# Patient Record
Sex: Male | Born: 1935 | Race: White | Hispanic: No
Health system: Southern US, Community
[De-identification: ages and names within clinical notes are randomized; demographics above are authoritative.]

## PROBLEM LIST (undated history)

## (undated) DIAGNOSIS — F0390 Unspecified dementia without behavioral disturbance: Secondary | ICD-10-CM

---

## 2020-07-22 ENCOUNTER — Emergency Department: Payer: Medicare Other

## 2020-07-22 ENCOUNTER — Emergency Department
Admission: EM | Admit: 2020-07-22 | Discharge: 2020-07-22 | Disposition: A | Discharge status: Home / Self Care | Payer: Medicare Other | Attending: Emergency Medicine | Admitting: Emergency Medicine

## 2020-07-22 ENCOUNTER — Other Ambulatory Visit: Payer: Self-pay

## 2020-07-22 DIAGNOSIS — Z7901 Long term (current) use of anticoagulants: Secondary | ICD-10-CM | POA: Insufficient documentation

## 2020-07-22 DIAGNOSIS — F0390 Unspecified dementia without behavioral disturbance: Secondary | ICD-10-CM | POA: Insufficient documentation

## 2020-07-22 DIAGNOSIS — S4992XA Unspecified injury of left shoulder and upper arm, initial encounter: Secondary | ICD-10-CM | POA: Diagnosis present

## 2020-07-22 DIAGNOSIS — S40022A Contusion of left upper arm, initial encounter: Secondary | ICD-10-CM | POA: Diagnosis not present

## 2020-07-22 DIAGNOSIS — X58XXXA Exposure to other specified factors, initial encounter: Secondary | ICD-10-CM | POA: Diagnosis not present

## 2020-07-22 LAB — COMPREHENSIVE METABOLIC PANEL
ALT: 9 U/L (ref 0–44)
AST: 21 U/L (ref 15–41)
Albumin: 3.6 g/dL (ref 3.5–5.0)
Alkaline Phosphatase: 67 U/L (ref 38–126)
Anion gap: 7 (ref 5–15)
BUN: 18 mg/dL (ref 8–23)
CO2: 25 mmol/L (ref 22–32)
Calcium: 8.4 mg/dL — ABNORMAL LOW (ref 8.9–10.3)
Chloride: 107 mmol/L (ref 98–111)
Creatinine, Ser: 1.44 mg/dL — ABNORMAL HIGH (ref 0.61–1.24)
GFR, Estimated: 44 mL/min — ABNORMAL LOW (ref >60)
Glucose, Bld: 85 mg/dL (ref 70–99)
Potassium: 3.8 mmol/L (ref 3.5–5.1)
Sodium: 139 mmol/L (ref 135–145)
Total Bilirubin: 1.2 mg/dL (ref 0.3–1.2)
Total Protein: 6.6 g/dL (ref 6.5–8.1)

## 2020-07-22 LAB — CBC WITH DIFFERENTIAL/PLATELET
Abs Immature Granulocytes: 0.04 10*3/uL (ref 0.00–0.07)
Basophils Absolute: 0.1 10*3/uL (ref 0.0–0.1)
Basophils Relative: 1 %
Eosinophils Absolute: 0.2 10*3/uL (ref 0.0–0.5)
Eosinophils Relative: 3 %
HCT: 24.6 % — ABNORMAL LOW (ref 39.0–52.0)
Hemoglobin: 8.1 g/dL — ABNORMAL LOW (ref 13.0–17.0)
Immature Granulocytes: 1 %
Lymphocytes Relative: 15 %
Lymphs Abs: 1 10*3/uL (ref 0.7–4.0)
MCH: 33.5 pg (ref 26.0–34.0)
MCHC: 32.9 g/dL (ref 30.0–36.0)
MCV: 101.7 fL — ABNORMAL HIGH (ref 80.0–100.0)
Monocytes Absolute: 0.6 10*3/uL (ref 0.1–1.0)
Monocytes Relative: 9 %
Neutro Abs: 4.8 10*3/uL (ref 1.7–7.7)
Neutrophils Relative %: 71 %
Platelets: 172 10*3/uL (ref 150–400)
RBC: 2.42 MIL/uL — ABNORMAL LOW (ref 4.22–5.81)
RDW: 17.4 % — ABNORMAL HIGH (ref 11.5–15.5)
WBC: 6.8 10*3/uL (ref 4.0–10.5)
nRBC: 0 % (ref 0.0–0.2)

## 2020-07-22 LAB — TROPONIN I (HIGH SENSITIVITY): Troponin I (High Sensitivity): 6 ng/L (ref <18)

## 2020-07-22 LAB — TYPE AND SCREEN
ABO/RH(D): O POS
Antibody Screen: NEGATIVE

## 2020-07-22 LAB — LACTIC ACID, PLASMA: Lactic Acid, Venous: 1.8 mmol/L (ref 0.5–1.9)

## 2020-07-22 NOTE — ED Notes (Signed)
[facility]  CALLED FOR  TRANSPORT  TO  [facility]

## 2020-07-22 NOTE — ED Triage Notes (Signed)
Patient to ER for c/o swelling to left arm since yesterday. Patient denies any known injury. Patient's vitals WNL per EMS. Patient takes Coumadin. Pulses present and WNL.

## 2020-07-22 NOTE — ED Provider Notes (Signed)
[facility] Emergency Department Provider Note   ____________________________________________   Event Date/Time   First MD Initiated Contact with Patient 07/22/20 1026     (approximate)  I have reviewed the triage vital signs and the nursing notes.   HISTORY  Chief Complaint Arm swelling    HPI Luis Doyle is a 87 y.o. male with a past medical history of dementia and A. fib on Coumadin who presents for left upper extremity ecchymosis and swelling since yesterday.  Patient presents via EMS from a long-term care facility who states they are concerned patient may have a DVT.  Patient denies any concerns or complaints at this time.  Patient is pleasantly demented and further history and review of systems are unable to be assessed at this time.         No past medical history on file.  There are no problems to display for this patient.     Prior to Admission medications   Not on File    Allergies Patient has no known allergies.  No family history on file.  Social History    Review of Systems Unable to assess ____________________________________________   PHYSICAL EXAM:  VITAL SIGNS: ED Triage Vitals  Enc Vitals Group     BP 07/22/20 1100 (!) 131/56     Pulse Rate 07/22/20 1024 (!) 59     Resp 07/22/20 1024 17     Temp 07/22/20 1024 98.8 F (37.1 C)     Temp Source 07/22/20 1024 Oral     SpO2 07/22/20 1024 100 %     Weight 07/22/20 1025 158 lb 1.1 oz (71.7 kg)     Height 07/22/20 1025 5\' 11"  (1.803 m)     Head Circumference --      Peak Flow --      Pain Score 07/22/20 1025 0     Pain Loc --      Pain Edu? --      Excl. in GC? --    Constitutional: Alert and oriented x1. Well appearing and in no acute distress. Eyes: Conjunctivae are normal. PERRL. Head: Atraumatic. Nose: No congestion/rhinnorhea. Mouth/Throat: Mucous membranes are moist. Neck: No stridor Cardiovascular: Grossly normal heart sounds.  Good peripheral  circulation. Respiratory: Normal respiratory effort.  No retractions. Gastrointestinal: Soft and nontender. No distention. Musculoskeletal: No obvious deformities Neurologic:  Normal speech and language. No gross focal neurologic deficits are appreciated. Skin:  Skin is warm and dry.  Circumferential ecchymosis appreciated to the left arm from the humerus to the hand sparing the palm Psychiatric: Mood and affect are normal. Speech and behavior are normal.  ____________________________________________   LABS (all labs ordered are listed, but only abnormal results are displayed)  Labs Reviewed  COMPREHENSIVE METABOLIC PANEL - Abnormal; Notable for the following components:      Result Value   Creatinine, Ser 1.44 (*)    Calcium 8.4 (*)    GFR, Estimated 44 (*)    All other components within normal limits  CBC WITH DIFFERENTIAL/PLATELET - Abnormal; Notable for the following components:   RBC 2.42 (*)    Hemoglobin 8.1 (*)    HCT 24.6 (*)    MCV 101.7 (*)    RDW 17.4 (*)    All other components within normal limits  LACTIC ACID, PLASMA  LACTIC ACID, PLASMA  TYPE AND SCREEN  TROPONIN I (HIGH SENSITIVITY)    RADIOLOGY  ED MD interpretation: Doppler ultrasound of the left upper extremity does not show  any evidence of acute DVT, abscess, or significant cellulitis  Official radiology report(s): US Venous Img Upper Left (DVT Study)  Result Date: 07/22/2020 CLINICAL DATA:  Left upper extremity edema EXAM: LEFT UPPER EXTREMITY VENOUS DOPPLER ULTRASOUND TECHNIQUE: Gray-scale sonography with graded compression, as well as color Doppler and duplex ultrasound were performed to evaluate the upper extremity deep venous system from the level of the subclavian vein and including the jugular, axillary, basilic, radial, ulnar and upper cephalic vein. Spectral Doppler was utilized to evaluate flow at rest and with distal augmentation maneuvers. COMPARISON:  None. FINDINGS: Contralateral Subclavian  Vein: Respiratory phasicity is normal and symmetric with the symptomatic side. No evidence of thrombus. Normal compressibility. Internal Jugular Vein: No evidence of thrombus. Normal compressibility, respiratory phasicity and response to augmentation. Subclavian Vein: No evidence of thrombus. Normal compressibility, respiratory phasicity and response to augmentation. Axillary Vein: No evidence of thrombus. Normal compressibility, respiratory phasicity and response to augmentation. Cephalic Vein: No evidence of thrombus. Normal compressibility, respiratory phasicity and response to augmentation. Basilic Vein: No evidence of thrombus. Normal compressibility, respiratory phasicity and response to augmentation. Brachial Veins: No evidence of thrombus. Normal compressibility, respiratory phasicity and response to augmentation. Radial Veins: No evidence of thrombus. Normal compressibility, respiratory phasicity and response to augmentation. Ulnar Veins: No evidence of thrombus. Normal compressibility, respiratory phasicity and response to augmentation. Venous Reflux:  None visualized. Other Findings:  None visualized. IMPRESSION: No evidence of DVT within the left upper extremity. Electronically Signed   By: Malachy Moan M.D.   On: 07/22/2020 11:38    ____________________________________________   PROCEDURES  Procedure(s) performed (including Critical Care):  .1-3 Lead EKG Interpretation Performed by: Merwyn Katos, MD Authorized by: Merwyn Katos, MD     Interpretation: normal     ECG rate:  66   ECG rate assessment: normal     Rhythm: sinus rhythm     Ectopy: none     Conduction: normal       ____________________________________________   INITIAL IMPRESSION / ASSESSMENT AND PLAN / ED COURSE  As part of my medical decision making, I reviewed the following data within the electronic MEDICAL RECORD NUMBER Nursing notes reviewed and incorporated, Labs reviewed, EKG interpreted, Old chart  reviewed, Radiograph reviewed and Notes from prior ED visits reviewed and incorporated        Patient is a 87 year old male who presents for left upper extremity swelling via EMS diagnosis for this patient includes but is not limited to DVT, cellulitis, abscess, arterial ischemia  Doppler ultrasound left upper extremity shows no evidence of DVT, edema, or significant abscess.  Patient is anemic at 8.1 with no prior for comparison.  Patient does not show any signs of tachycardia, hypotension, or hypoxia concerning for symptomatic anemia.  Dispo: The patient has been reexamined and is ready to be discharged.  All diagnostic results have been reviewed and discussed with the patient/family.  Care plan has been outlined and the patient/family understands all current diagnoses, results, and treatment plans.  There are no new complaints, changes, or physical findings at this time.  All questions have been addressed and answered.  Patient was instructed to, and agrees to follow-up with their primary care physician as well as return to the emergency department if any new or worsening symptoms develop.      ____________________________________________   FINAL CLINICAL IMPRESSION(S) / ED DIAGNOSES  Final diagnoses:  Traumatic ecchymosis of left upper arm, initial encounter     ED Discharge Orders  None       Note:  This document was prepared using Dragon voice recognition software and may include unintentional dictation errors.   Merwyn Katos, MD 07/22/20 [phone]

## 2020-07-22 NOTE — ED Notes (Signed)
Patient provided with meal while he awaits transport to [facility]

## 2020-09-20 ENCOUNTER — Ambulatory Visit (LOCAL_COMMUNITY_HEALTH_CENTER): Payer: Medicare Other

## 2020-09-20 ENCOUNTER — Telehealth: Payer: Self-pay

## 2020-09-20 VITALS — Ht 71.0 in | Wt 158.0 lb

## 2020-09-20 DIAGNOSIS — R7612 Nonspecific reaction to cell mediated immunity measurement of gamma interferon antigen response without active tuberculosis: Secondary | ICD-10-CM

## 2020-09-20 NOTE — Progress Notes (Signed)
EPI completed via phone with Tiffany from [facility]. [phone]). Patient's medical hx reviewed with Tiffany, not patient.  +QFT and CXR on 09/16/20. No known exposure to TB per patient's sone and has always had negative PPDs in the past; last PPD 11/2019.  Patient is a Sales executive.   CXR faxed for Dr. Alvester Morin review. No concerns for Active TB.  LTBI tx can be left up to the facility MD per [facility]. CXR sent for scanning Richmond Campbell, RN

## 2020-09-20 NOTE — Telephone Encounter (Signed)
TC received from Tiffany at [facility] on 09/18/20. States patient had +QFT on 09/14/20 and CXR completed following results.  CXR faxed to [facility] for Dr. Alvester Morin review. Dr. Alvester Morin reviewed CXR on 09/19/20 and has no concerns re: Active TB.  Per Dr. Alvester Morin facility physician can decided whether or not to treat patient for LTBI.  Patient has always had negative PPDs in the past per Tiffany.  See EPI for more info.Richmond Campbell, RN

## 2020-09-27 ENCOUNTER — Telehealth: Payer: Self-pay

## 2020-09-27 ENCOUNTER — Ambulatory Visit (INDEPENDENT_AMBULATORY_CARE_PROVIDER_SITE_OTHER): Payer: Medicare Other | Admitting: Internal Medicine

## 2020-09-27 ENCOUNTER — Encounter: Payer: Self-pay | Admitting: Internal Medicine

## 2020-09-27 ENCOUNTER — Other Ambulatory Visit: Payer: Self-pay

## 2020-09-27 VITALS — BP 146/63 | HR 69 | Temp 97.5°F | Wt 156.0 lb

## 2020-09-27 DIAGNOSIS — R7612 Nonspecific reaction to cell mediated immunity measurement of gamma interferon antigen response without active tuberculosis: Secondary | ICD-10-CM

## 2020-09-27 NOTE — Progress Notes (Signed)
    [facility]      Reason for Consult: positive Luis Doyle test    Referring Physician: Dr. Kerry Dory    Patient ID: Luis Doyle, male    DOB: 1922/10/24, 87 y.o.   MRN: [id number]  HPI:   Here for evaluation of a positive Quantiferon Gold test.  Previously tested negative in 2021.  Testing done as part of some routine testing requirement by the state of residents at his facility though no clear indication.  No history of Tb, no history of BCG vaccine, not receiving any immunsuppressive medications.  He is here with a caregiver from his facility who helps with the history.  He is located in the memory care until at [facility] in [place].  He had a CXR with subtle findings of left midlung infiltrate and left base and a small right pleural effusion.  He is asymptomatic though with no cough, no fever, no sob, no reported new dypsnea.  He though was put on levaquin for presumed pneumonia.  Previously tested negative to Tb with a ppd in 2021 by report. He is former Hotel manager.     \PMH: dementia, afib  Prior to Admission medications   Not on File    No Known Allergies  Social History   Tobacco Use  . Smoking status: Unknown If Ever Smoked  . Smokeless tobacco: Never Used  Vaping Use  . Vaping Use: Never used  Substance Use Topics  . Alcohol use: Not Currently  . Drug use: Not Currently   [facility]: + cardiac disease  Review of Systems  Constitutional: negative for fevers and chills Respiratory: negative for cough or sputum All other systems reviewed and are negative    Constitutional: well developed and well nourished There were no vitals filed for this visit. EYES: anicteric Cardiovascular: Cor RRR Respiratory: clear Musculoskeletal: no pedal edema noted Skin: negatives: no rash Neuro: non-focal  Labs: Lab Results  Component Value Date   WBC 6.8 07/22/2020   HGB 8.1 (L) 07/22/2020   HCT 24.6 (L) 07/22/2020   MCV 101.7 (H) 07/22/2020    PLT 172 07/22/2020    Lab Results  Component Value Date   CREATININE 1.44 (H) 07/22/2020   BUN 18 07/22/2020   NA 139 07/22/2020   K 3.8 07/22/2020   CL 107 07/22/2020   CO2 25 07/22/2020    Lab Results  Component Value Date   ALT 9 07/22/2020   AST 21 07/22/2020   ALKPHOS 67 07/22/2020   BILITOT 1.2 07/22/2020     Assessment: latent Tb.  He was tested reportedly due to a state requirment but otherwise had no particular indication for treatment.  He previously tested negative so I will repeat the blood test to see if it remains negative.   If positive, will offer treatment with INH 300 mg daily plus vitamin B6 50 mcg daily for 9 months Will then check a hepatic panel after 4 weeks  Currently being treated for pneumonia.  He is asymptomatic at this time so appears resolved.  I suspect his diagnosis was purely related to the very subtle findings on CXR but without symptoms is not significant and does not warrant treatment.   Plan: 1) repeat Quant Gold testing. If positive again, will treat as above  2) consider stopping levaquin

## 2020-09-27 NOTE — Telephone Encounter (Signed)
Patient is staying at [facility] in [place], contact number for them is [phone]. Danne Baxter is familiar with patient.   Sandie Ano, RN

## 2020-09-29 LAB — QUANTIFERON-TB GOLD PLUS
Mitogen-NIL: 1.91 IU/mL
NIL: 0.02 IU/mL
QuantiFERON-TB Gold Plus: NEGATIVE
TB1-NIL: 0.01 IU/mL
TB2-NIL: 0.03 IU/mL

## 2020-09-30 ENCOUNTER — Telehealth: Payer: Self-pay

## 2020-09-30 NOTE — Telephone Encounter (Signed)
RN attempted to call [facility] in [place], unable to speak with Danne Baxter at this time. RN called patient's son, voicemail greeting did not match son's name in chart.   Sandie Ano, RN

## 2020-09-30 NOTE — Telephone Encounter (Signed)
-----   Message from Gardiner Barefoot, MD sent at 09/30/2020  1:24 PM EDT ----- Could you let his facility and/or son know that his repeat Quantiferon Gold test is negative so his other positive test is more c/w a false positive and so no indication for any latent Tb treatment thanks

## 2020-10-01 NOTE — Telephone Encounter (Signed)
RN spoke to [employer], Engineer, civil (consulting) at [place], to relay negative [employer] and that there is no need for latent TB treatment per Dr. Luciana Axe as his prior positive test was consistent with a false positive. Tiffany requested faxed copy of results. RN faxed negative test result to [facility] at [phone].   Sandie Ano, RN

## 2021-01-16 ENCOUNTER — Emergency Department
Admission: EM | Admit: 2021-01-16 | Discharge: 2021-01-16 | Disposition: A | Discharge status: Home / Self Care | Payer: Medicare Other | Attending: Emergency Medicine | Admitting: Emergency Medicine

## 2021-01-16 ENCOUNTER — Emergency Department: Payer: Medicare Other

## 2021-01-16 ENCOUNTER — Other Ambulatory Visit: Payer: Self-pay

## 2021-01-16 DIAGNOSIS — S0990XA Unspecified injury of head, initial encounter: Secondary | ICD-10-CM

## 2021-01-16 DIAGNOSIS — S0083XA Contusion of other part of head, initial encounter: Secondary | ICD-10-CM | POA: Insufficient documentation

## 2021-01-16 NOTE — Discharge Instructions (Addendum)
Luis Doyle had a CT scan of his head and cervical spine which showed no evidence of acute brain spine injury.  He should return to the emergency department for new or worsening swelling, severe headache, confusion or change in mental status, or any other new or worsening symptoms that are concerning.
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
## 2021-01-16 NOTE — ED Notes (Signed)
Patient transported to CT 

## 2021-01-16 NOTE — ED Notes (Signed)
Pt's son called and updated on plan of care
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
## 2021-01-16 NOTE — ED Notes (Signed)
[facility]  called  for  transport  to  [facility]

## 2021-04-12 ENCOUNTER — Other Ambulatory Visit: Payer: Self-pay

## 2021-04-12 ENCOUNTER — Emergency Department
Admission: EM | Admit: 2021-04-12 | Discharge: 2021-04-12 | Disposition: A | Discharge status: Home / Self Care | Payer: Medicare Other | Attending: Emergency Medicine | Admitting: Emergency Medicine

## 2021-04-12 ENCOUNTER — Emergency Department: Payer: Medicare Other

## 2021-04-12 DIAGNOSIS — Z7901 Long term (current) use of anticoagulants: Secondary | ICD-10-CM | POA: Insufficient documentation

## 2021-04-12 DIAGNOSIS — F0390 Unspecified dementia without behavioral disturbance: Secondary | ICD-10-CM | POA: Diagnosis present

## 2021-04-12 DIAGNOSIS — N400 Enlarged prostate without lower urinary tract symptoms: Secondary | ICD-10-CM | POA: Diagnosis not present

## 2021-04-12 DIAGNOSIS — Z20822 Contact with and (suspected) exposure to covid-19: Secondary | ICD-10-CM | POA: Diagnosis not present

## 2021-04-12 DIAGNOSIS — R112 Nausea with vomiting, unspecified: Secondary | ICD-10-CM | POA: Insufficient documentation

## 2021-04-12 DIAGNOSIS — R1110 Vomiting, unspecified: Secondary | ICD-10-CM

## 2021-04-12 DIAGNOSIS — J189 Pneumonia, unspecified organism: Secondary | ICD-10-CM

## 2021-04-12 LAB — URINALYSIS, ROUTINE W REFLEX MICROSCOPIC
Bacteria, UA: NONE SEEN
Bilirubin Urine: NEGATIVE
Glucose, UA: NEGATIVE mg/dL
Ketones, ur: NEGATIVE mg/dL
Leukocytes,Ua: NEGATIVE
Nitrite: NEGATIVE
Protein, ur: NEGATIVE mg/dL
Specific Gravity, Urine: 1.021 (ref 1.005–1.030)
pH: 5 (ref 5.0–8.0)

## 2021-04-12 LAB — COMPREHENSIVE METABOLIC PANEL
ALT: 12 U/L (ref 0–44)
AST: 21 U/L (ref 15–41)
Albumin: 3.9 g/dL (ref 3.5–5.0)
Alkaline Phosphatase: 75 U/L (ref 38–126)
Anion gap: 9 (ref 5–15)
BUN: 22 mg/dL (ref 8–23)
CO2: 24 mmol/L (ref 22–32)
Calcium: 8.6 mg/dL — ABNORMAL LOW (ref 8.9–10.3)
Chloride: 104 mmol/L (ref 98–111)
Creatinine, Ser: 1.44 mg/dL — ABNORMAL HIGH (ref 0.61–1.24)
GFR, Estimated: 44 mL/min — ABNORMAL LOW (ref >60)
Glucose, Bld: 147 mg/dL — ABNORMAL HIGH (ref 70–99)
Potassium: 4.2 mmol/L (ref 3.5–5.1)
Sodium: 137 mmol/L (ref 135–145)
Total Bilirubin: 1.2 mg/dL (ref 0.3–1.2)
Total Protein: 7.3 g/dL (ref 6.5–8.1)

## 2021-04-12 LAB — RESP PANEL BY RT-PCR (FLU A&B, COVID) ARPGX2
Influenza A by PCR: NEGATIVE
Influenza B by PCR: NEGATIVE
SARS Coronavirus 2 by RT PCR: NEGATIVE

## 2021-04-12 LAB — CBC
HCT: 37.2 % — ABNORMAL LOW (ref 39.0–52.0)
Hemoglobin: 12.4 g/dL — ABNORMAL LOW (ref 13.0–17.0)
MCH: 33.7 pg (ref 26.0–34.0)
MCHC: 33.3 g/dL (ref 30.0–36.0)
MCV: 101.1 fL — ABNORMAL HIGH (ref 80.0–100.0)
Platelets: 155 10*3/uL (ref 150–400)
RBC: 3.68 MIL/uL — ABNORMAL LOW (ref 4.22–5.81)
RDW: 15.5 % (ref 11.5–15.5)
WBC: 13.9 10*3/uL — ABNORMAL HIGH (ref 4.0–10.5)
nRBC: 0 % (ref 0.0–0.2)

## 2021-04-12 LAB — LIPASE, BLOOD: Lipase: 32 U/L (ref 11–51)

## 2021-04-12 MED ORDER — AZITHROMYCIN 250 MG PO TABS: ORAL_TABLET | ORAL | 0 refills | Status: DC

## 2021-04-12 MED ORDER — CEFDINIR 300 MG PO CAPS
300.0000 mg | ORAL_CAPSULE | Freq: Two times a day (BID) | ORAL | Status: DC
  Administered 2021-04-12: 300 mg via ORAL
  Filled 2021-04-12 (×2): qty 1

## 2021-04-12 MED ORDER — ONDANSETRON 4 MG PO TBDP: 4.0000 mg | ORAL_TABLET | Freq: Three times a day (TID) | ORAL | 0 refills | Status: DC | PRN

## 2021-04-12 MED ORDER — AZITHROMYCIN 500 MG PO TABS
500.0000 mg | ORAL_TABLET | Freq: Once | ORAL | Status: AC
  Administered 2021-04-12: 500 mg via ORAL
  Filled 2021-04-12: qty 1

## 2021-04-12 MED ORDER — CEFDINIR 300 MG PO CAPS: 300.0000 mg | ORAL_CAPSULE | Freq: Two times a day (BID) | ORAL | 0 refills | Status: DC

## 2021-04-12 NOTE — ED Provider Notes (Signed)
[facility] Emergency Department Provider Note  ____________________________________________  Time seen: Approximately 2:09 PM  I have reviewed the triage vital signs and the nursing notes.   HISTORY  Chief Complaint Emesis    Level 5 Caveat: Portions of the History and Physical including HPI and review of systems are unable to be completely obtained due to patient being a poor historian   HPI Luis Doyle is a 87 y.o. male with a history of dementia who is sent to the ED due to an episode of vomiting this morning.  At [facility], his skilled nursing facility, he was noted to appear pale afterward, so EMS was called to bring him to the hospital.  Patient is unable to recall what happened earlier at this point.  He denies any pain.  States that he feels fine.  He is drinking a cup of water.  At bedside denies any other known symptoms, no recent illness.    History reviewed. No pertinent past medical history.   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 No pertinent surgical history.   Prior to Admission medications   Medication Sig Start Date End Date Taking? Authorizing Provider  acetaminophen (TYLENOL) 325 MG tablet Take 650 mg by mouth every 4 (four) hours as needed.    [provider]  ascorbic acid (VITAMIN C) 250 MG tablet Take 250 mg by mouth daily.    [provider]  Calcium Carbonate 500 MG CHEW Chew by mouth every 3 (three) hours as needed.    [provider]  Cholecalciferol (VITAMIN D3) 10 MCG (400 UNIT) CAPS Take by mouth daily.    [provider]  docusate sodium (COLACE) 100 MG capsule Take 100 mg by mouth daily.    [provider]  donepezil (ARICEPT) 10 MG tablet Take 10 mg by mouth daily. 09/16/20   [provider]  levofloxacin  (LEVAQUIN) 500 MG tablet Take 500 mg by mouth daily. Patient not taking: Reported on 01/16/2021 09/18/20   [provider]  Multiple Vitamin (MULTI VITAMIN PO) Take by mouth daily.    [provider]  ondansetron (ZOFRAN-ODT) 4 MG disintegrating tablet Take by mouth. 08/12/20   [provider]  saccharomyces boulardii (FLORASTOR) 250 MG capsule Take 250 mg by mouth 2 (two) times daily. Patient not taking: Reported on 01/16/2021    [provider]  sertraline (ZOLOFT) 25 MG tablet Take 25 mg by mouth daily. 09/16/20   [provider]  Thiamine HCl (THIAMINE PO) Take by mouth daily.    [provider]  warfarin (COUMADIN) 1 MG tablet Take 0.5 mg by mouth daily.    [provider]  warfarin (COUMADIN) 5 MG tablet Take 5 mg by mouth daily. Patient not taking: No sig reported 09/24/20   [provider]  warfarin (COUMADIN) 6 MG tablet Take 6 mg by mouth daily. 01/13/21   [provider]  zinc sulfate 220 (50 Zn) MG capsule Take 220 mg by mouth daily.    [provider]     Allergies Patient has no known allergies.   No family history on file.  Social History Social History   Tobacco Use   Smoking status: Unknown   Smokeless tobacco: Never  Vaping Use   Vaping Use: Never used  Substance Use Topics   Alcohol use: Not Currently   Drug  use: Not Currently    Review of Systems Level 5 Caveat: Portions of the History and Physical including HPI and review of systems are unable to be completely obtained due to patient being a poor historian   Constitutional:   No known fever.  ENT:   No rhinorrhea. Cardiovascular:   No chest pain or syncope. Respiratory:   No dyspnea or cough. Gastrointestinal:   Negative for abdominal pain, positive episode of vomiting and diarrhea Musculoskeletal:   Negative for focal pain or swelling ____________________________________________   PHYSICAL EXAM:  VITAL SIGNS: ED  Triage Vitals  Enc Vitals Group     BP 04/12/21 1043 (!) 126/57     Pulse Rate 04/12/21 1043 74     Resp 04/12/21 1043 20     Temp 04/12/21 1041 98.7 F (37.1 C)     Temp Source 04/12/21 1041 Oral     SpO2 04/12/21 1043 93 %     Weight 04/12/21 1042 145 lb (65.8 kg)     Height 04/12/21 1042 5\' 10"  (1.778 m)     Head Circumference --      Peak Flow --      Pain Score 04/12/21 1042 0     Pain Loc --      Pain Edu? --      Excl. in GC? --     Vital signs reviewed, nursing assessments reviewed.   Constitutional:   Alert and oriented to person and place. Non-toxic appearance. Eyes:   Conjunctivae are normal. EOMI. PERRL. ENT      Head:   Normocephalic and atraumatic.      Nose:   No congestion/rhinnorhea.       Mouth/Throat:   MMM, no pharyngeal erythema. No peritonsillar mass.       Neck:   No meningismus. Full ROM. Hematological/Lymphatic/Immunilogical:   No cervical lymphadenopathy. Cardiovascular:   RRR. Symmetric bilateral radial and DP pulses.  No murmurs. Cap refill less than 2 seconds. Respiratory:   Normal respiratory effort without tachypnea/retractions. Breath sounds are clear and equal bilaterally. No wheezes/rales/rhonchi. Gastrointestinal:   Soft and nontender. Non distended. There is no CVA tenderness.  No rebound, rigidity, or guarding. Genitourinary:   deferred Musculoskeletal:   Normal range of motion in all extremities. No joint effusions.  No lower extremity tenderness.  No edema. Neurologic:   Normal speech and language.  Motor grossly intact. No acute focal neurologic deficits are appreciated.  Skin:    Skin is warm, dry and intact. No rash noted.  No petechiae, purpura, or bullae.  ____________________________________________    LABS (pertinent positives/negatives) (all labs ordered are listed, but only abnormal results are displayed) Labs Reviewed  COMPREHENSIVE METABOLIC PANEL - Abnormal; Notable for the following components:      Result Value    Glucose, Bld 147 (*)    Creatinine, Ser 1.44 (*)    Calcium 8.6 (*)    GFR, Estimated 44 (*)    All other components within normal limits  CBC - Abnormal; Notable for the following components:   WBC 13.9 (*)    RBC 3.68 (*)    Hemoglobin 12.4 (*)    HCT 37.2 (*)    MCV 101.1 (*)    All other components within normal limits  URINALYSIS, ROUTINE W REFLEX MICROSCOPIC - Abnormal; Notable for the following components:   Color, Urine YELLOW (*)    APPearance HAZY (*)    Hgb urine dipstick MODERATE (*)    All other components within normal  limits  RESP PANEL BY RT-PCR (FLU A&B, COVID) ARPGX2  LIPASE, BLOOD   ____________________________________________   EKG Interpreted by me Normal sinus rhythm rate of 77, normal axis and intervals.  Normal QRS ST segments and T waves.  1 PVC on the strip.   ____________________________________________    RADIOLOGY  DG Chest 2 View  Result Date: 04/12/2021 CLINICAL DATA:  Cough, vomiting EXAM: CHEST - 2 VIEW COMPARISON:  None. FINDINGS: Prior median sternotomy and cardiac valve replacement. The heart size and mediastinal contours are within normal limits. Aortic atherosclerosis. Coarsened interstitial markings bilaterally with patchy airspace opacity in the left lower lobe. Healed left clavicular fracture. IMPRESSION: Patchy airspace opacity in the left lower lobe, suspicious for pneumonia. Electronically Signed   By: Duanne Guess D.O.   On: 04/12/2021 13:36    ____________________________________________   PROCEDURES Procedures  ____________________________________________  DIFFERENTIAL DIAGNOSIS   Bowel obstruction, hernia, diverticulitis, viral illness, foodborne illness  CLINICAL IMPRESSION / ASSESSMENT AND PLAN / ED COURSE  Medications ordered in the ED: Medications - No data to display  Pertinent labs & imaging results that were available during my care of the patient were reviewed by me and considered in my medical decision  making (see chart for details).   Luis Doyle was evaluated in Emergency Department on 04/12/2021 for the symptoms described in the history of present illness. He was evaluated in the context of the global COVID-19 pandemic, which necessitated consideration that the patient might be at risk for infection with the SARS-CoV-2 virus that causes COVID-19. Institutional protocols and algorithms that pertain to the evaluation of patients at risk for COVID-19 are in a state of rapid change based on information released by regulatory bodies including the CDC and federal and state organizations. These policies and algorithms were followed during the patient's care in the ED.   Patient presents with an episode of vomiting.  Denies any pain.  Exam is benign and reassuring.  However, with his dementia and inability to recall events of earlier and reliably report any symptoms, I will obtain a CT scan to evaluate for obstruction primarily.  Patient is tolerating oral intake and asymptomatic currently.      ____________________________________________   FINAL CLINICAL IMPRESSION(S) / ED DIAGNOSES    Final diagnoses:  Vomiting, unspecified vomiting type, unspecified whether nausea present  Chronic dementia [facility])     ED Discharge Orders     None       Portions of this note were generated with dragon dictation software. Dictation errors may occur despite best attempts at proofreading.   Sharman Cheek, MD 04/12/21 [phone]

## 2021-04-12 NOTE — Discharge Instructions (Signed)
Your lab tests today were okay.  Your Flu and Covid swab was negative.  Your chest xray shows a small area of developing pneumonia in the bottom of the left lung.  Take antibiotics as prescribed to resolve this.  If you have worsening shortness of breath, please return to the ER for further evaluation.

## 2021-04-12 NOTE — ED Provider Notes (Signed)
-----------------------------------------   3:09 PM on 04/12/2021 -----------------------------------------  Blood pressure (!) 125/53, pulse 77, temperature 98.7 F (37.1 C), temperature source Oral, resp. rate 18, height 5\' 10"  (1.778 m), weight 65.8 kg, SpO2 96 %.  Assuming care from Dr. .  In short, Luis Doyle is a 87 y.o. male with a chief complaint of Emesis .  Refer to the original H&P for additional details.  The current plan of care is to follow-up CT imaging of abdomen for acute onset of nausea and vomiting, remainder of workup is reassuring and if CT is negative then patient would be appropriate for discharge home.  ----------------------------------------- 3:53 PM on 04/12/2021 ----------------------------------------- CT scan is significant for what appears to be an enteritis along with opacities at patient's lung bases likely representing early pneumonia.  On reassessment, patient remains well-appearing and states he is feeling great.  He denies any difficulty breathing, nausea, chest pain, or abdominal pain.  He was given initial dose of antibiotics and prescribed antibiotic course per Dr. 04/14/2021.  He is appropriate for discharge back to nursing facility.    Scotty Court, MD 04/12/21 [phone]

## 2021-04-12 NOTE — ED Notes (Addendum)
Pt son called for transport back to [facility]

## 2021-04-12 NOTE — ED Triage Notes (Signed)
Pt to ED [facility] from [facility]. Son with pt. Reports flu sx started today. Emesis and diarrhea.  Pt denies pain. HOH

## 2021-09-27 ENCOUNTER — Encounter: Payer: Self-pay | Admitting: Emergency Medicine

## 2021-09-27 ENCOUNTER — Emergency Department
Admission: EM | Admit: 2021-09-27 | Discharge: 2021-09-27 | Disposition: A | Discharge status: Home / Self Care | Payer: Medicare Other | Attending: Emergency Medicine | Admitting: Emergency Medicine

## 2021-09-27 ENCOUNTER — Other Ambulatory Visit: Payer: Self-pay

## 2021-09-27 DIAGNOSIS — Z7901 Long term (current) use of anticoagulants: Secondary | ICD-10-CM | POA: Insufficient documentation

## 2021-09-27 DIAGNOSIS — R791 Abnormal coagulation profile: Secondary | ICD-10-CM | POA: Diagnosis not present

## 2021-09-27 DIAGNOSIS — F0390 Unspecified dementia without behavioral disturbance: Secondary | ICD-10-CM | POA: Diagnosis not present

## 2021-09-27 HISTORY — DX: Unspecified dementia, unspecified severity, without behavioral disturbance, psychotic disturbance, mood disturbance, and anxiety: F03.90

## 2021-09-27 LAB — CBC
HCT: 34 % — ABNORMAL LOW (ref 39.0–52.0)
Hemoglobin: 11.2 g/dL — ABNORMAL LOW (ref 13.0–17.0)
MCH: 33 pg (ref 26.0–34.0)
MCHC: 32.9 g/dL (ref 30.0–36.0)
MCV: 100.3 fL — ABNORMAL HIGH (ref 80.0–100.0)
Platelets: 181 10*3/uL (ref 150–400)
RBC: 3.39 MIL/uL — ABNORMAL LOW (ref 4.22–5.81)
RDW: 15.8 % — ABNORMAL HIGH (ref 11.5–15.5)
WBC: 10.2 10*3/uL (ref 4.0–10.5)
nRBC: 0 % (ref 0.0–0.2)

## 2021-09-27 LAB — COMPREHENSIVE METABOLIC PANEL
ALT: 16 U/L (ref 0–44)
AST: 21 U/L (ref 15–41)
Albumin: 3.7 g/dL (ref 3.5–5.0)
Alkaline Phosphatase: 87 U/L (ref 38–126)
Anion gap: 4 — ABNORMAL LOW (ref 5–15)
BUN: 30 mg/dL — ABNORMAL HIGH (ref 8–23)
CO2: 21 mmol/L — ABNORMAL LOW (ref 22–32)
Calcium: 8.4 mg/dL — ABNORMAL LOW (ref 8.9–10.3)
Chloride: 113 mmol/L — ABNORMAL HIGH (ref 98–111)
Creatinine, Ser: 1.5 mg/dL — ABNORMAL HIGH (ref 0.61–1.24)
GFR, Estimated: 42 mL/min — ABNORMAL LOW (ref >60)
Glucose, Bld: 98 mg/dL (ref 70–99)
Potassium: 3.8 mmol/L (ref 3.5–5.1)
Sodium: 138 mmol/L (ref 135–145)
Total Bilirubin: 0.6 mg/dL (ref 0.3–1.2)
Total Protein: 7.1 g/dL (ref 6.5–8.1)

## 2021-09-27 LAB — PROTIME-INR
INR: 9.5 (ref 0.8–1.2)
Prothrombin Time: 76.4 seconds — ABNORMAL HIGH (ref 11.4–15.2)

## 2021-09-27 NOTE — ED Notes (Signed)
Called EMS for transport back to [facility] 

## 2021-09-27 NOTE — ED Provider Notes (Signed)
? ?[facility] ?Provider Note ? ? ? Event Date/Time  ? First MD Initiated Contact with Patient 09/27/21 0243   ?  (approximate) ? ? ?History  ? ?Abnormal Labs  ? ? ?HPI ? ?Luis Doyle is a 88 y.o. male with a history of dementia who was sent from  house for elevated INR.  Patient is not sure why he is on Coumadin.  There are no records of the indication for Coumadin at the nursing home.  Review of epic shows prior records from the [place] and he seems like patient may have a prosthetic valve.  Patient is not sure.  His INR is elevated at 10 at the nursing home.  There is no signs of bleeding.  Patient denies hematuria, hemoptysis, melena, hematochezia, hematemesis, coffee-ground emesis. ?  ? ? ?Past Medical History:  ?Diagnosis Date  ? Dementia (HCC)   ? ? ?No past surgical history on file. ? ? ?Physical Exam  ? ?Triage Vital Signs: ?ED Triage Vitals  ?Enc Vitals Group  ?   BP 09/27/21 0033 (!) 142/65  ?   Pulse Rate 09/27/21 0033 67  ?   Resp 09/27/21 0033 16  ?   Temp 09/27/21 0033 98 ?F (36.7 ?C)  ?   Temp Source 09/27/21 0033 Oral  ?   SpO2 09/27/21 0033 99 %  ?   Weight 09/27/21 0034 150 lb (68 kg)  ?   Height 09/27/21 0034 6' (1.829 m)  ?   Head Circumference --   ?   Peak Flow --   ?   Pain Score 09/27/21 0034 0  ?   Pain Loc --   ?   Pain Edu? --   ?   Excl. in GC? --   ? ? ?Most recent vital signs: ?Vitals:  ? 09/27/21 0033  ?BP: (!) 142/65  ?Pulse: 67  ?Resp: 16  ?Temp: 98 ?F (36.7 ?C)  ?SpO2: 99%  ? ? ? ?Constitutional: Alert and oriented. Well appearing and in no apparent distress. ?HEENT: ?     Head: Normocephalic and atraumatic.    ?     Eyes: Conjunctivae are normal. Sclera is non-icteric.  ?     Mouth/Throat: Mucous membranes are moist.  ?     Neck: Supple with no signs of meningismus. ?Cardiovascular: Regular rate and rhythm. No murmurs, gallops, or rubs. 2+ symmetrical distal pulses are present in all extremities.  ?Respiratory: Normal respiratory effort. Lungs are clear  to auscultation bilaterally.  ?Gastrointestinal: Soft, non tender. ?Musculoskeletal:  No edema, cyanosis, or erythema of extremities. ?Neurologic: Normal speech and language. Face is symmetric. Moving all extremities. No gross focal neurologic deficits are appreciated. ?Skin: Skin is warm, dry and intact. No rash noted. ?Psychiatric: Mood and affect are normal. Speech and behavior are normal. ? ?ED Results / Procedures / Treatments  ? ?Labs ?(all labs ordered are listed, but only abnormal results are displayed) ?Labs Reviewed  ?PROTIME-INR - Abnormal; Notable for the following components:  ?    Result Value  ? Prothrombin Time 76.4 (*)   ? INR 9.5 (*)   ? All other components within normal limits  ?CBC - Abnormal; Notable for the following components:  ? RBC 3.39 (*)   ? Hemoglobin 11.2 (*)   ? HCT 34.0 (*)   ? MCV 100.3 (*)   ? RDW 15.8 (*)   ? All other components within normal limits  ?COMPREHENSIVE METABOLIC PANEL - Abnormal; Notable for the following components:  ?  Chloride 113 (*)   ? CO2 21 (*)   ? BUN 30 (*)   ? Creatinine, Ser 1.50 (*)   ? Calcium 8.4 (*)   ? GFR, Estimated 42 (*)   ? Anion gap 4 (*)   ? All other components within normal limits  ? ? ? ?EKG ? ?none ? ? ?RADIOLOGY ?none ? ? ?PROCEDURES: ? ?Critical Care performed: No ? ?Procedures ? ? ? ?IMPRESSION / MDM / ASSESSMENT AND PLAN / ED COURSE  ?I reviewed the triage vital signs and the nursing notes. ? ?88 y.o. male with a history of dementia who was sent from [facility] for elevated INR with no active bleeding. Patient is HD stable, stable hgb 11.2. INR here 9.5. Unclear why patient is on coumadin. Nursing home has no record for the indication of this medication. Patient does not know either. Based of limited records from the [place] it seems like patient has a mechanical valve therefore I don't feel that it is safe to give him a dose of vitamin K especially since there is no signs of bleeding. Recommended holding the coumadin for 2 days and  repeating INR on Monday. If INR is back to normal <3, they can restart coumadin. Recommended cutting the dose by 25% and close INR monitoring. Also recommended return to the ER for any signs of bleeding.  ? ?_________________________ ?3:26 AM on 09/27/2021 ?----------------------------------------- ?I was able to reach patient's son on the phone who confirms that patient is on Coumadin because of a mechanical valve therefore we will hold off vitamin K as previously stated. ? ?MEDICATIONS GIVEN IN ED: ?Medications - No data to display ? ? ?EMR reviewed records from the [place] ? ? ? ?FINAL CLINICAL IMPRESSION(S) / ED DIAGNOSES  ? ?Final diagnoses:  ?Supratherapeutic INR  ? ? ? ?Rx / DC Orders  ? ?ED Discharge Orders   ? ? None  ? ?  ? ? ? ?Note:  This document was prepared using Dragon voice recognition software and may include unintentional dictation errors. ? ? ?Please note:  Patient was evaluated in Emergency Department today for the symptoms described in the history of present illness. Patient was evaluated in the context of the global COVID-19 pandemic, which necessitated consideration that the patient might be at risk for infection with the SARS-CoV-2 virus that causes COVID-19. Institutional protocols and algorithms that pertain to the evaluation of patients at risk for COVID-19 are in a state of rapid change based on information released by regulatory bodies including the CDC and federal and state organizations. These policies and algorithms were followed during the patient's care in the ED.  Some ED evaluations and interventions may be delayed as a result of limited staffing during the pandemic. ? ? ? ? ?  ?Nita Sickle, MD ?09/27/21 0327 ? ?

## 2021-09-27 NOTE — Discharge Instructions (Signed)
Hold coumadin over the weekend. On Monday, recheck his INR. If INR>3 continue to hold and check INR daily. If INR<3 restart coumadin but decrease the dose to 4mg  daily. Recommend frequent INR checks until patient has stabilize his INR. Make sure to have close follow up with his doctor to help manage his INR.  Turn to the ER immediately if there is any bleeding in the urine, stool, sputum, or anywhere else. ?

## 2021-09-27 NOTE — ED Notes (Signed)
Pt takes warfarin 6mg  daily  ?

## 2021-09-27 NOTE — ED Triage Notes (Signed)
Pt presents to ER via EMS from [facility]. Per EMS pt sent due to abnormal labs. PT/INR 90/10.65 from blood work he had done today. Pt has dementia as baseline  ?

## 2022-08-09 ENCOUNTER — Other Ambulatory Visit: Payer: Self-pay

## 2022-08-09 ENCOUNTER — Emergency Department: Payer: Medicare Other

## 2022-08-09 ENCOUNTER — Emergency Department
Admission: EM | Admit: 2022-08-09 | Discharge: 2022-08-09 | Disposition: A | Discharge status: Home / Self Care | Payer: Medicare Other | Attending: Emergency Medicine | Admitting: Emergency Medicine

## 2022-08-09 DIAGNOSIS — N189 Chronic kidney disease, unspecified: Secondary | ICD-10-CM | POA: Insufficient documentation

## 2022-08-09 DIAGNOSIS — Z7901 Long term (current) use of anticoagulants: Secondary | ICD-10-CM | POA: Insufficient documentation

## 2022-08-09 DIAGNOSIS — D649 Anemia, unspecified: Secondary | ICD-10-CM | POA: Diagnosis not present

## 2022-08-09 DIAGNOSIS — R531 Weakness: Secondary | ICD-10-CM | POA: Diagnosis present

## 2022-08-09 DIAGNOSIS — R296 Repeated falls: Secondary | ICD-10-CM | POA: Insufficient documentation

## 2022-08-09 DIAGNOSIS — F0390 Unspecified dementia without behavioral disturbance: Secondary | ICD-10-CM | POA: Diagnosis not present

## 2022-08-09 LAB — URINALYSIS, ROUTINE W REFLEX MICROSCOPIC
Bilirubin Urine: NEGATIVE
Glucose, UA: NEGATIVE mg/dL
Hgb urine dipstick: NEGATIVE
Ketones, ur: NEGATIVE mg/dL
Leukocytes,Ua: NEGATIVE
Nitrite: NEGATIVE
Protein, ur: NEGATIVE mg/dL
Specific Gravity, Urine: 1.019 (ref 1.005–1.030)
pH: 5 (ref 5.0–8.0)

## 2022-08-09 LAB — CBC WITH DIFFERENTIAL/PLATELET
Abs Immature Granulocytes: 0.03 10*3/uL (ref 0.00–0.07)
Basophils Absolute: 0.1 10*3/uL (ref 0.0–0.1)
Basophils Relative: 1 %
Eosinophils Absolute: 0.2 10*3/uL (ref 0.0–0.5)
Eosinophils Relative: 2 %
HCT: 28.4 % — ABNORMAL LOW (ref 39.0–52.0)
Hemoglobin: 9.2 g/dL — ABNORMAL LOW (ref 13.0–17.0)
Immature Granulocytes: 0 %
Lymphocytes Relative: 17 %
Lymphs Abs: 1.2 10*3/uL (ref 0.7–4.0)
MCH: 33.8 pg (ref 26.0–34.0)
MCHC: 32.4 g/dL (ref 30.0–36.0)
MCV: 104.4 fL — ABNORMAL HIGH (ref 80.0–100.0)
Monocytes Absolute: 0.6 10*3/uL (ref 0.1–1.0)
Monocytes Relative: 8 %
Neutro Abs: 5.1 10*3/uL (ref 1.7–7.7)
Neutrophils Relative %: 72 %
Platelets: 112 10*3/uL — ABNORMAL LOW (ref 150–400)
RBC: 2.72 MIL/uL — ABNORMAL LOW (ref 4.22–5.81)
RDW: 16.4 % — ABNORMAL HIGH (ref 11.5–15.5)
WBC: 7.2 10*3/uL (ref 4.0–10.5)
nRBC: 0 % (ref 0.0–0.2)

## 2022-08-09 LAB — TROPONIN I (HIGH SENSITIVITY): Troponin I (High Sensitivity): 8 ng/L

## 2022-08-09 LAB — COMPREHENSIVE METABOLIC PANEL WITH GFR
ALT: 13 U/L (ref 0–44)
AST: 25 U/L (ref 15–41)
Albumin: 3.9 g/dL (ref 3.5–5.0)
Alkaline Phosphatase: 74 U/L (ref 38–126)
Anion gap: 4 — ABNORMAL LOW (ref 5–15)
BUN: 26 mg/dL — ABNORMAL HIGH (ref 8–23)
CO2: 26 mmol/L (ref 22–32)
Calcium: 8.4 mg/dL — ABNORMAL LOW (ref 8.9–10.3)
Chloride: 107 mmol/L (ref 98–111)
Creatinine, Ser: 1.32 mg/dL — ABNORMAL HIGH (ref 0.61–1.24)
GFR, Estimated: 49 mL/min — ABNORMAL LOW
Glucose, Bld: 96 mg/dL (ref 70–99)
Potassium: 3.5 mmol/L (ref 3.5–5.1)
Sodium: 137 mmol/L (ref 135–145)
Total Bilirubin: 0.7 mg/dL (ref 0.3–1.2)
Total Protein: 6.8 g/dL (ref 6.5–8.1)

## 2022-08-09 LAB — PROTIME-INR
INR: 2.6 — ABNORMAL HIGH (ref 0.8–1.2)
Prothrombin Time: 27.9 seconds — ABNORMAL HIGH (ref 11.4–15.2)

## 2022-08-09 NOTE — ED Notes (Signed)
Patient transported to CT 

## 2022-08-09 NOTE — ED Provider Notes (Signed)
[facility] Provider Note    Event Date/Time   First MD Initiated Contact with Patient 08/09/22 1357     (approximate)   History   Chief Complaint Fall   HPI  Luis Doyle is a 89 y.o. 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  Per son at bedside, patient was found down on the ground at his nursing facility earlier this morning, was able to stand up with the assistance of staff and was placed back in bed.  When staff went to get him for lunch, they again found him sitting down on the ground with no obvious signs of trauma.  Patient states he does not remember falling and currently denies any complaints.  He specifically denies any headache, neck pain, chest pain, abdominal pain, or extremity pain.  Son states that he is acting normally with no change in his cognitive status, does state he seems generally weaker than usual, had a harder time transitioning from wheelchair to stretcher than he normally would.  He has not had any recent fevers, son does endorse chronic cough that has seemed slightly worse than usual.  Patient denies any chest pain, shortness of breath, or dysuria.     Physical Exam   Triage Vital Signs: ED Triage Vitals  Enc Vitals Group     BP 08/09/22 1258 (!) 141/65     Pulse Rate 08/09/22 1258 70     Resp 08/09/22 1258 18     Temp 08/09/22 1258 98 F (36.7 C)     Temp src --      SpO2 08/09/22 1258 95 %     Weight --      Height --      Head Circumference --      Peak Flow --      Pain Score 08/09/22 1256 0     Pain Loc --      Pain Edu? --      Excl. in [place]? --     Most recent vital signs: Vitals:   08/09/22 1258  BP: (!) 141/65  Pulse: 70  Resp: 18  Temp: 98 F (36.7 C)  SpO2: 95%    Constitutional: Alert and oriented to person and place, but not time or situation. Eyes: Conjunctivae are normal. Head: Atraumatic. Nose: No  congestion/rhinnorhea. Mouth/Throat: Mucous membranes are moist.  Neck: No midline cervical spine tenderness to palpation. Cardiovascular: Normal rate, regular rhythm. Grossly normal heart sounds.  2+ radial pulses bilaterally. Respiratory: Normal respiratory effort.  No retractions. Lungs CTAB.  No chest wall tenderness to palpation. Gastrointestinal: Soft and nontender. No distention. Musculoskeletal: No lower extremity tenderness nor edema.  No upper extremity bony tenderness to palpation. Neurologic:  Normal speech and language. No gross focal neurologic deficits are appreciated.    ED Results / Procedures / Treatments   Labs (all labs ordered are listed, but only abnormal results are displayed) Labs Reviewed  URINALYSIS, ROUTINE W REFLEX MICROSCOPIC - Abnormal; Notable for the following components:      Result Value   Color, Urine YELLOW (*)    APPearance HAZY (*)    All other components within normal limits  CBC WITH DIFFERENTIAL/PLATELET - Abnormal; Notable for the following components:   RBC 2.72 (*)    Hemoglobin 9.2 (*)    HCT 28.4 (*)    MCV 104.4 (*)    RDW 16.4 (*)  Platelets 112 (*)    All other components within normal limits  COMPREHENSIVE METABOLIC PANEL - Abnormal; Notable for the following components:   BUN 26 (*)    Creatinine, Ser 1.32 (*)    Calcium 8.4 (*)    GFR, Estimated 49 (*)    Anion gap 4 (*)    All other components within normal limits  PROTIME-INR - Abnormal; Notable for the following components:   Prothrombin Time 27.9 (*)    INR 2.6 (*)    All other components within normal limits  TROPONIN I (HIGH SENSITIVITY)     EKG  ED ECG REPORT I, Blake Divine, the attending physician, personally viewed and interpreted this ECG.   Date: 08/09/2022  EKG Time: 15:31  Rate: 59  Rhythm: normal sinus rhythm  Axis: Normal  Intervals:none  ST&T Change: None  RADIOLOGY CT head reviewed and interpreted by me with no hemorrhage or midline  shift.  CT cervical spine reviewed and interpreted by me with no fracture or dislocation.  PROCEDURES:  Critical Care performed: No  Procedures   MEDICATIONS ORDERED IN ED: Medications - No data to display   IMPRESSION / MDM / [place] / ED COURSE  I reviewed the triage vital signs and the nursing notes.                              89 y.o. 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  Patient's presentation is most consistent with acute presentation with potential threat to life or bodily function.  Differential diagnosis includes, but is not limited to, intracranial injury, cervical spine injury, anemia, electrolyte abnormality, AKI, UTI, pneumonia.  Patient chronically ill but nontoxic-appearing and in no acute distress.  Vital signs are unremarkable and he has no focal neurologic deficits on exam, appears to be at his baseline mental status per son.  CT head and cervical spine are negative for acute traumatic injury.  Son does report patient appears generally weaker than usual and urinalysis shows no signs of infection.  We will further assess with chest x-ray, EKG, and labs.  EKG shows borderline sinus bradycardia with no ischemic changes, troponin within normal limits.  Additional labs are also reassuring with stable chronic kidney disease, no acute electrolyte abnormality, LFT abnormality, or leukocytosis.  Patient with mild anemia compared to previous but no recent bleeding noted.  Chest x-ray without evidence of pneumonia or other acute process.  Patient is appropriate for discharge home with outpatient follow-up, has follow-up with his PCP in 2 days.  Son counseled to have him return to the ED for new or worsening symptoms, son agrees with plan.      FINAL CLINICAL IMPRESSION(S) / ED  DIAGNOSES   Final diagnoses:  Multiple falls  Generalized weakness     Rx / DC Orders   ED Discharge Orders     None        Note:  This document was prepared using Dragon voice recognition software and may include unintentional dictation errors.   Blake Divine, MD 08/09/22 [phone]

## 2022-08-09 NOTE — ED Triage Notes (Signed)
Pt comes with c/o fall. Pt states he fell twice today. Pt is from [facility]. Pt was found in the floor by staff. Pt fell again before lunch too. Pt has skin tear to right elbow. Unsure if pt hit head.  Staff concerned for possible UTI also.

## 2022-08-09 NOTE — ED Notes (Addendum)
Pt discharge to home. Pt VSS, GCS 14, NAD. Pt family verbalized understanding of discharge instructions with no additional questions at this time. RN called report [facility] and notified of patient return.

## 2023-05-10 ENCOUNTER — Emergency Department
Admission: EM | Admit: 2023-05-10 | Discharge: 2023-05-10 | Disposition: A | Discharge status: Home / Self Care | Payer: Medicare Other | Attending: Student in an Organized Health Care Education/Training Program | Admitting: Student in an Organized Health Care Education/Training Program

## 2023-05-10 ENCOUNTER — Emergency Department: Payer: Medicare Other

## 2023-05-10 ENCOUNTER — Encounter: Payer: Self-pay | Admitting: Emergency Medicine

## 2023-05-10 ENCOUNTER — Other Ambulatory Visit: Payer: Self-pay

## 2023-05-10 DIAGNOSIS — R55 Syncope and collapse: Secondary | ICD-10-CM | POA: Diagnosis present

## 2023-05-10 DIAGNOSIS — Z20822 Contact with and (suspected) exposure to covid-19: Secondary | ICD-10-CM | POA: Diagnosis not present

## 2023-05-10 LAB — TROPONIN I (HIGH SENSITIVITY)
Troponin I (High Sensitivity): 7 ng/L (ref <18)
Troponin I (High Sensitivity): 8 ng/L (ref <18)

## 2023-05-10 LAB — URINALYSIS, ROUTINE W REFLEX MICROSCOPIC
Bilirubin Urine: NEGATIVE
Glucose, UA: NEGATIVE mg/dL
Ketones, ur: NEGATIVE mg/dL
Leukocytes,Ua: NEGATIVE
Nitrite: NEGATIVE
Protein, ur: NEGATIVE mg/dL
Specific Gravity, Urine: 1.018 (ref 1.005–1.030)
Squamous Epithelial / HPF: 0 /[HPF] (ref 0–5)
pH: 5 (ref 5.0–8.0)

## 2023-05-10 LAB — CBC WITH DIFFERENTIAL/PLATELET
Abs Immature Granulocytes: 0.07 10*3/uL (ref 0.00–0.07)
Basophils Absolute: 0.1 10*3/uL (ref 0.0–0.1)
Basophils Relative: 1 %
Eosinophils Absolute: 0.1 10*3/uL (ref 0.0–0.5)
Eosinophils Relative: 1 %
HCT: 31.8 % — ABNORMAL LOW (ref 39.0–52.0)
Hemoglobin: 10 g/dL — ABNORMAL LOW (ref 13.0–17.0)
Immature Granulocytes: 1 %
Lymphocytes Relative: 6 %
Lymphs Abs: 0.7 10*3/uL (ref 0.7–4.0)
MCH: 34 pg (ref 26.0–34.0)
MCHC: 31.4 g/dL (ref 30.0–36.0)
MCV: 108.2 fL — ABNORMAL HIGH (ref 80.0–100.0)
Monocytes Absolute: 0.6 10*3/uL (ref 0.1–1.0)
Monocytes Relative: 6 %
Neutro Abs: 9.8 10*3/uL — ABNORMAL HIGH (ref 1.7–7.7)
Neutrophils Relative %: 85 %
Platelets: 177 10*3/uL (ref 150–400)
RBC: 2.94 MIL/uL — ABNORMAL LOW (ref 4.22–5.81)
RDW: 16.7 % — ABNORMAL HIGH (ref 11.5–15.5)
WBC: 11.3 10*3/uL — ABNORMAL HIGH (ref 4.0–10.5)
nRBC: 0 % (ref 0.0–0.2)

## 2023-05-10 LAB — BASIC METABOLIC PANEL
Anion gap: 10 (ref 5–15)
BUN: 29 mg/dL — ABNORMAL HIGH (ref 8–23)
CO2: 29 mmol/L (ref 22–32)
Calcium: 8.6 mg/dL — ABNORMAL LOW (ref 8.9–10.3)
Chloride: 100 mmol/L (ref 98–111)
Creatinine, Ser: 1.24 mg/dL (ref 0.61–1.24)
GFR, Estimated: 53 mL/min — ABNORMAL LOW (ref >60)
Glucose, Bld: 116 mg/dL — ABNORMAL HIGH (ref 70–99)
Potassium: 4 mmol/L (ref 3.5–5.1)
Sodium: 139 mmol/L (ref 135–145)

## 2023-05-10 LAB — RESP PANEL BY RT-PCR (RSV, FLU A&B, COVID)  RVPGX2
Influenza A by PCR: NEGATIVE
Influenza B by PCR: NEGATIVE
Resp Syncytial Virus by PCR: NEGATIVE
SARS Coronavirus 2 by RT PCR: NEGATIVE

## 2023-05-10 NOTE — ED Notes (Signed)
First Nurse Note: Pt to ED via [facility] from [facility] for syncope. Unsure how long he was out for. Pt was sitting in a chair and did not fall. Pt does have a cough as well. CBG 175  BP: 146/60

## 2023-05-10 NOTE — ED Triage Notes (Signed)
Patient to ED via [facility] from [facility] after syncopal episode. PT was walking out of room when he had syncopal episode- was eased down in the floor with assistance of family and staff. PA at facility listened to patient afterwards and is concerned for pneumonia. Has had wet cough recently per family. Denies pain.

## 2023-05-10 NOTE — ED Provider Triage Note (Signed)
Emergency Medicine Provider Triage Evaluation Note  Luis Doyle , a 89 y.o. male  was evaluated in triage.  Pt complains of syncopal episode as he was walking out of his room, family was nearby and caught him, lowered him to the floor. Did not hit his head. PA at the facility believes he has pneumonia.   Review of Systems  Positive: cough Negative: Pain   Physical Exam  There were no vitals taken for this visit. Gen:   Awake, no distress   Resp:  Normal effort  MSK:   Moves extremities without difficulty  Other:    Medical Decision Making  Medically screening exam initiated at 10:15 AM.  Appropriate orders placed.  Wess Suleiman was informed that the remainder of the evaluation will be completed by another provider, this initial triage assessment does not replace that evaluation, and the importance of remaining in the ED until their evaluation is complete.     Cameron Ali, PA-C 05/10/23 1018

## 2023-05-10 NOTE — ED Provider Notes (Signed)
[facility] Provider Note    Event Date/Time   First MD Initiated Contact with Patient 05/10/23 Rickey Primus     (approximate)   History   Loss of Consciousness   HPI  Luis Doyle is a 89 y.o. male who presents to the ER from [facility] after syncopal event occurred earlier today.  He had a normal morning was ambulating down the hall and was being assisted out of the incorrect room weight he exhibited some weakness and was falling to the ground.  He was assisted to the ground did not hit his head.  Reportedly was unresponsive but breathing and had pulse for 20 to 30 seconds.  He did not have any shaking or seizure-like activity no postictal period he denies any pain.  No new medications.  Had prolonged observation period here in the ER secondary to excessive boarding and has not had any additional episodes or symptoms since being here.  According to report of family the staff did not listen to him were concerned for pneumonia but the patient has not been having any cough or shortness of breath.  No fevers or chills.     Physical Exam   Triage Vital Signs: ED Triage Vitals  Encounter Vitals Group     BP 05/10/23 1017 (!) 103/41     Systolic BP Percentile --      Diastolic BP Percentile --      Pulse Rate 05/10/23 1017 61     Resp 05/10/23 1017 18     Temp 05/10/23 1017 (!) 97.4 F (36.3 C)     Temp Source 05/10/23 1017 Axillary     SpO2 05/10/23 1017 91 %     Weight 05/10/23 1018 149 lb 14.6 oz (68 kg)     Height 05/10/23 1018 6' (1.829 m)     Head Circumference --      Peak Flow --      Pain Score 05/10/23 1018 0     Pain Loc --      Pain Education --      Exclude from Growth Chart --     Most recent vital signs: Vitals:   05/10/23 2007 05/10/23 2037  BP:  (!) 119/51  Pulse:  77  Resp: 18 17  Temp: 98.2 F (36.8 C)   SpO2:  95%     Constitutional: Alert, very hoh Eyes: Conjunctivae are normal.  Head: Atraumatic. Nose: No  congestion/rhinnorhea. Mouth/Throat: Mucous membranes are moist.   Neck: Painless ROM.  Cardiovascular:   Good peripheral circulation. Respiratory: Normal respiratory effort.  No retractions.  Gastrointestinal: Soft and nontender.  Musculoskeletal:  no deformity Neurologic:  MAE spontaneously. No gross focal neurologic deficits are appreciated.  Skin:  Skin is warm, dry and intact. No rash noted. Psychiatric: Mood and affect are normal. Speech and behavior are normal.    ED Results / Procedures / Treatments   Labs (all labs ordered are listed, but only abnormal results are displayed) Labs Reviewed  CBC WITH DIFFERENTIAL/PLATELET - Abnormal; Notable for the following components:      Result Value   WBC 11.3 (*)    RBC 2.94 (*)    Hemoglobin 10.0 (*)    HCT 31.8 (*)    MCV 108.2 (*)    RDW 16.7 (*)    Neutro Abs 9.8 (*)    All other components within normal limits  BASIC METABOLIC PANEL - Abnormal; Notable for the following components:   Glucose, Bld 116 (*)  BUN 29 (*)    Calcium 8.6 (*)    GFR, Estimated 53 (*)    All other components within normal limits  URINALYSIS, ROUTINE W REFLEX MICROSCOPIC - Abnormal; Notable for the following components:   Color, Urine YELLOW (*)    APPearance HAZY (*)    Hgb urine dipstick MODERATE (*)    Bacteria, UA RARE (*)    All other components within normal limits  RESP PANEL BY RT-PCR (RSV, FLU A&B, COVID)  [id number]  TROPONIN I (HIGH SENSITIVITY)  TROPONIN I (HIGH SENSITIVITY)     EKG  ED ECG REPORT I, Willy Eddy, the attending physician, personally viewed and interpreted this ECG.   Date: 05/10/2023  EKG Time: 10:20  Rate: 60  Rhythm: afib   Axis: normal  Intervals: normal qt  ST&T Change: no stemi, no depressiosn    RADIOLOGY Please see ED Course for my review and interpretation.  I personally reviewed all radiographic images ordered to evaluate for the above acute complaints and reviewed radiology reports and  findings.  These findings were personally discussed with the patient.  Please see medical record for radiology report.,   PROCEDURES:  Critical Care performed: No  Procedures   MEDICATIONS ORDERED IN ED: Medications - No data to display   IMPRESSION / MDM / ASSESSMENT AND PLAN / ED COURSE  I reviewed the triage vital signs and the nursing notes.                              Differential diagnosis includes, but is not limited to, dehydration, electrolyte abnormality, sepsis, dysrhythmia, ACS, SDH, IPH, vasovagal  Patient presenting to the ER for evaluation of symptoms as described above.  Based on symptoms, risk factors and considered above differential, this presenting complaint could reflect a potentially life-threatening illness therefore the patient will be placed on continuous pulse oximetry and telemetry for monitoring.  Laboratory evaluation will be sent to evaluate for the above complaints.      Clinical Course as of 05/10/23 2115  Mon May 10, 2023  2018 Chest x-ray on my review and interpretation without evidence of pneumothorax.  No findings to suggest acute abnormality. [PR]  2111 No sign of UTI.  Patient remains well-appearing is requesting discharge home.  Discussed the case and presentation workup with the patient's son POA.  Did not have any further concerns or questions.  We discussed option for observation in the hospital but given the patient's wishes to be discharged back to facility his age and goals of care I think that that is reasonable given his reassuring workup. [PR]    Clinical Course User Index [PR] Willy Eddy, MD     FINAL CLINICAL IMPRESSION(S) / ED DIAGNOSES   Final diagnoses:  Syncope, unspecified syncope type     Rx / DC Orders   ED Discharge Orders     None        Note:  This document was prepared using Dragon voice recognition software and may include unintentional dictation errors.    Willy Eddy, MD 05/10/23  2115

## 2023-05-10 NOTE — ED Notes (Signed)
Patient ambulatory to bed with one person assist. Pt has a very unsteady gait. Pt's clothing saturated in urine. Pt's clothing changed, and patient placed in bed in clean gown with warm blankets given.

## 2023-07-20 ENCOUNTER — Observation Stay
Admission: EM | Admit: 2023-07-20 | Discharge: 2023-07-22 | Disposition: A | Discharge status: Skilled Nursing Facility | Payer: Medicare Other | Attending: Obstetrics and Gynecology | Admitting: Obstetrics and Gynecology

## 2023-07-20 DIAGNOSIS — Z7901 Long term (current) use of anticoagulants: Secondary | ICD-10-CM | POA: Diagnosis not present

## 2023-07-20 DIAGNOSIS — Z23 Encounter for immunization: Secondary | ICD-10-CM | POA: Insufficient documentation

## 2023-07-20 DIAGNOSIS — I2510 Atherosclerotic heart disease of native coronary artery without angina pectoris: Secondary | ICD-10-CM | POA: Insufficient documentation

## 2023-07-20 DIAGNOSIS — N183 Chronic kidney disease, stage 3 unspecified: Secondary | ICD-10-CM | POA: Insufficient documentation

## 2023-07-20 DIAGNOSIS — R4182 Altered mental status, unspecified: Principal | ICD-10-CM | POA: Diagnosis present

## 2023-07-20 DIAGNOSIS — R451 Restlessness and agitation: Secondary | ICD-10-CM | POA: Diagnosis not present

## 2023-07-20 DIAGNOSIS — G9340 Encephalopathy, unspecified: Secondary | ICD-10-CM

## 2023-07-20 DIAGNOSIS — D649 Anemia, unspecified: Secondary | ICD-10-CM | POA: Diagnosis not present

## 2023-07-20 DIAGNOSIS — Z515 Encounter for palliative care: Secondary | ICD-10-CM | POA: Insufficient documentation

## 2023-07-20 DIAGNOSIS — T148XXA Other injury of unspecified body region, initial encounter: Secondary | ICD-10-CM

## 2023-07-20 DIAGNOSIS — Z79899 Other long term (current) drug therapy: Secondary | ICD-10-CM | POA: Insufficient documentation

## 2023-07-20 DIAGNOSIS — L989 Disorder of the skin and subcutaneous tissue, unspecified: Secondary | ICD-10-CM | POA: Diagnosis not present

## 2023-07-20 DIAGNOSIS — F0390 Unspecified dementia without behavioral disturbance: Secondary | ICD-10-CM | POA: Diagnosis not present

## 2023-07-20 DIAGNOSIS — Z952 Presence of prosthetic heart valve: Secondary | ICD-10-CM

## 2023-07-20 DIAGNOSIS — R456 Violent behavior: Secondary | ICD-10-CM | POA: Diagnosis present

## 2023-07-20 DIAGNOSIS — S299XXA Unspecified injury of thorax, initial encounter: Secondary | ICD-10-CM | POA: Diagnosis present

## 2023-07-21 ENCOUNTER — Emergency Department: Payer: Medicare Other

## 2023-07-21 ENCOUNTER — Other Ambulatory Visit: Payer: Self-pay

## 2023-07-21 DIAGNOSIS — D649 Anemia, unspecified: Secondary | ICD-10-CM | POA: Insufficient documentation

## 2023-07-21 DIAGNOSIS — Z7189 Other specified counseling: Secondary | ICD-10-CM

## 2023-07-21 DIAGNOSIS — R4182 Altered mental status, unspecified: Secondary | ICD-10-CM | POA: Diagnosis not present

## 2023-07-21 DIAGNOSIS — Z952 Presence of prosthetic heart valve: Secondary | ICD-10-CM

## 2023-07-21 DIAGNOSIS — F03918 Unspecified dementia, unspecified severity, with other behavioral disturbance: Secondary | ICD-10-CM | POA: Diagnosis not present

## 2023-07-21 DIAGNOSIS — N183 Chronic kidney disease, stage 3 unspecified: Secondary | ICD-10-CM | POA: Insufficient documentation

## 2023-07-21 DIAGNOSIS — G9340 Encephalopathy, unspecified: Secondary | ICD-10-CM

## 2023-07-21 LAB — CBC WITH DIFFERENTIAL/PLATELET
Abs Immature Granulocytes: 0.07 10*3/uL (ref 0.00–0.07)
Basophils Absolute: 0.1 10*3/uL (ref 0.0–0.1)
Basophils Relative: 1 %
Eosinophils Absolute: 0.1 10*3/uL (ref 0.0–0.5)
Eosinophils Relative: 1 %
HCT: 22.6 % — ABNORMAL LOW (ref 39.0–52.0)
Hemoglobin: 7.6 g/dL — ABNORMAL LOW (ref 13.0–17.0)
Immature Granulocytes: 1 %
Lymphocytes Relative: 13 %
Lymphs Abs: 1.2 10*3/uL (ref 0.7–4.0)
MCH: 33.9 pg (ref 26.0–34.0)
MCHC: 33.6 g/dL (ref 30.0–36.0)
MCV: 100.9 fL — ABNORMAL HIGH (ref 80.0–100.0)
Monocytes Absolute: 0.8 10*3/uL (ref 0.1–1.0)
Monocytes Relative: 9 %
Neutro Abs: 6.5 10*3/uL (ref 1.7–7.7)
Neutrophils Relative %: 75 %
Platelets: 159 10*3/uL (ref 150–400)
RBC: 2.24 MIL/uL — ABNORMAL LOW (ref 4.22–5.81)
RDW: 16.2 % — ABNORMAL HIGH (ref 11.5–15.5)
WBC: 8.6 10*3/uL (ref 4.0–10.5)
nRBC: 0 % (ref 0.0–0.2)

## 2023-07-21 LAB — URINALYSIS, ROUTINE W REFLEX MICROSCOPIC
Bacteria, UA: NONE SEEN
Bilirubin Urine: NEGATIVE
Glucose, UA: NEGATIVE mg/dL
Ketones, ur: NEGATIVE mg/dL
Leukocytes,Ua: NEGATIVE
Nitrite: NEGATIVE
Protein, ur: NEGATIVE mg/dL
Specific Gravity, Urine: 1.013 (ref 1.005–1.030)
pH: 6 (ref 5.0–8.0)

## 2023-07-21 LAB — COMPREHENSIVE METABOLIC PANEL
ALT: 17 U/L (ref 0–44)
AST: 22 U/L (ref 15–41)
Albumin: 3.2 g/dL — ABNORMAL LOW (ref 3.5–5.0)
Alkaline Phosphatase: 75 U/L (ref 38–126)
Anion gap: 9 (ref 5–15)
BUN: 26 mg/dL — ABNORMAL HIGH (ref 8–23)
CO2: 26 mmol/L (ref 22–32)
Calcium: 8.3 mg/dL — ABNORMAL LOW (ref 8.9–10.3)
Chloride: 104 mmol/L (ref 98–111)
Creatinine, Ser: 1.41 mg/dL — ABNORMAL HIGH (ref 0.61–1.24)
GFR, Estimated: 45 mL/min — ABNORMAL LOW (ref >60)
Glucose, Bld: 104 mg/dL — ABNORMAL HIGH (ref 70–99)
Potassium: 3.6 mmol/L (ref 3.5–5.1)
Sodium: 139 mmol/L (ref 135–145)
Total Bilirubin: 0.9 mg/dL (ref 0.0–1.2)
Total Protein: 6.2 g/dL — ABNORMAL LOW (ref 6.5–8.1)

## 2023-07-21 LAB — PREPARE RBC (CROSSMATCH)

## 2023-07-21 LAB — RETICULOCYTES
Immature Retic Fract: 12.8 % (ref 2.3–15.9)
RBC.: 2.26 MIL/uL — ABNORMAL LOW (ref 4.22–5.81)
Retic Count, Absolute: 28.5 10*3/uL (ref 19.0–186.0)
Retic Ct Pct: 1.3 % (ref 0.4–3.1)

## 2023-07-21 LAB — PROTIME-INR
INR: 2.3 — ABNORMAL HIGH (ref 0.8–1.2)
Prothrombin Time: 25.4 s — ABNORMAL HIGH (ref 11.4–15.2)

## 2023-07-21 MED ORDER — LORAZEPAM 2 MG/ML PO CONC: 1.0000 mg | ORAL | Status: DC | PRN

## 2023-07-21 MED ORDER — MORPHINE SULFATE (CONCENTRATE) 10 MG /0.5 ML PO SOLN: 5.0000 mg | ORAL | Status: DC | PRN

## 2023-07-21 MED ORDER — POLYVINYL ALCOHOL 1.4 % OP SOLN: 1.0000 [drp] | Freq: Four times a day (QID) | OPHTHALMIC | Status: DC | PRN

## 2023-07-21 MED ORDER — LIDOCAINE-EPINEPHRINE (PF) 2 %-1:200000 IJ SOLN
20.0000 mL | Freq: Once | INTRAMUSCULAR | Status: AC
  Administered 2023-07-21: 20 mL via INTRADERMAL
  Filled 2023-07-21: qty 20

## 2023-07-21 MED ORDER — IOHEXOL 300 MG/ML  SOLN
100.0000 mL | Freq: Once | INTRAMUSCULAR | Status: AC | PRN
  Administered 2023-07-21: 100 mL via INTRAVENOUS

## 2023-07-21 MED ORDER — LORAZEPAM 2 MG/ML IJ SOLN: 1.0000 mg | INTRAMUSCULAR | Status: DC | PRN

## 2023-07-21 MED ORDER — BACITRACIN ZINC 500 UNIT/GM EX OINT
TOPICAL_OINTMENT | Freq: Once | CUTANEOUS | Status: AC
  Administered 2023-07-21: 2 via TOPICAL
  Filled 2023-07-21: qty 1.8

## 2023-07-21 MED ORDER — ONDANSETRON HCL 4 MG PO TABS: 4.0000 mg | ORAL_TABLET | Freq: Four times a day (QID) | ORAL | Status: DC | PRN

## 2023-07-21 MED ORDER — ACETAMINOPHEN 325 MG PO TABS: 650.0000 mg | ORAL_TABLET | Freq: Four times a day (QID) | ORAL | Status: DC | PRN

## 2023-07-21 MED ORDER — GLYCOPYRROLATE 1 MG PO TABS: 1.0000 mg | ORAL_TABLET | ORAL | Status: DC | PRN

## 2023-07-21 MED ORDER — BACITRACIN ZINC 500 UNIT/GM EX OINT
TOPICAL_OINTMENT | Freq: Once | CUTANEOUS | Status: AC
  Filled 2023-07-21: qty 1.8

## 2023-07-21 MED ORDER — LORAZEPAM 1 MG PO TABS: 1.0000 mg | ORAL_TABLET | ORAL | Status: DC | PRN

## 2023-07-21 MED ORDER — TETANUS-DIPHTH-ACELL PERTUSSIS 5-2.5-18.5 LF-MCG/0.5 IM SUSY
0.5000 mL | PREFILLED_SYRINGE | Freq: Once | INTRAMUSCULAR | Status: AC
  Administered 2023-07-21: 0.5 mL via INTRAMUSCULAR
  Filled 2023-07-21: qty 0.5

## 2023-07-21 MED ORDER — HALOPERIDOL LACTATE 5 MG/ML IJ SOLN: 0.5000 mg | INTRAMUSCULAR | Status: DC | PRN

## 2023-07-21 MED ORDER — GLYCOPYRROLATE 0.2 MG/ML IJ SOLN: 0.2000 mg | INTRAMUSCULAR | Status: DC | PRN

## 2023-07-21 MED ORDER — HALOPERIDOL LACTATE 2 MG/ML PO CONC: 0.5000 mg | ORAL | Status: DC | PRN

## 2023-07-21 MED ORDER — LORAZEPAM 2 MG/ML IJ SOLN
0.5000 mg | Freq: Once | INTRAMUSCULAR | Status: AC
  Administered 2023-07-21: 0.5 mg via INTRAVENOUS
  Filled 2023-07-21: qty 1

## 2023-07-21 MED ORDER — LORAZEPAM 2 MG/ML IJ SOLN
1.0000 mg | Freq: Once | INTRAMUSCULAR | Status: AC
  Administered 2023-07-21: 1 mg via INTRAMUSCULAR
  Filled 2023-07-21: qty 1

## 2023-07-21 MED ORDER — SODIUM CHLORIDE 0.9 % IV SOLN: INTRAVENOUS | Status: DC

## 2023-07-21 MED ORDER — ONDANSETRON HCL 4 MG/2ML IJ SOLN: 4.0000 mg | Freq: Four times a day (QID) | INTRAMUSCULAR | Status: DC | PRN

## 2023-07-21 MED ORDER — BIOTENE DRY MOUTH MT LIQD: 15.0000 mL | OROMUCOSAL | Status: DC | PRN

## 2023-07-21 MED ORDER — SODIUM CHLORIDE 0.9 % IV SOLN
10.0000 mL/h | Freq: Once | INTRAVENOUS | Status: AC
  Administered 2023-07-21: 10 mL/h via INTRAVENOUS

## 2023-07-21 MED ORDER — ACETAMINOPHEN 650 MG RE SUPP: 650.0000 mg | Freq: Four times a day (QID) | RECTAL | Status: DC | PRN

## 2023-07-21 MED ORDER — HALOPERIDOL 0.5 MG PO TABS: 0.5000 mg | ORAL_TABLET | ORAL | Status: DC | PRN
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
## 2023-07-21 NOTE — Assessment & Plan Note (Addendum)
 Dementia  Progressive worsening confusion in setting of end stage dementia with ? Acute decompensation  CT imaging grossly WNL  No overt infection noted- UA WNL  Clinically dry  Noted decompensation in baseline hgb w/ coumadin use (hemoccult negative)  Monitor hgb  IVF hydration  Prn medication for agitation  Follow

## 2023-07-21 NOTE — Assessment & Plan Note (Addendum)
 Hgb 7.6 today w/ baseline hgb around 10-11  Noted coumadin use in setting of mechanical aortic valve  Hemoccult negative  S/p 1u pRBC  Will monitor hgb  Transfuse for hgb <7  Consider tagged RBC scan as appropriate  Palliative care consulted
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
## 2023-07-21 NOTE — Assessment & Plan Note (Signed)
 Cr 1.4 w/ GFR in 40s  Near baseline though clinically dry  IVF hydration  Monitor

## 2023-07-21 NOTE — ED Notes (Signed)
 Pt resting with eyes closed and mouth opened. Even rise and fall of chest. Call bell in reach. Bed in lowest locked position

## 2023-07-21 NOTE — H&P (Signed)
 History and Physical    Patient: Luis Doyle:[id number] DOB: 1922/08/19 DOA: 07/20/2023 DOS: the patient was seen and examined on 07/21/2023 PCP: Angela Cox, MD (Inactive)  Patient coming from: Home  Chief Complaint:  Chief Complaint  Patient presents with   Aggressive Behavior   HPI: Luis Doyle is a 89 y.o. 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  Limited history in the setting of encephalopathy.  Per report, patient with progressive agitation and confusion at memory care unit.  Noted to be significantly confused and talking to the son yesterday.  Per report, patient with severe agitation with patient being aggressive toward other patients in the memory unit.  No reported fevers or chills.  No reported nausea or vomiting.  No reports of cough or shortness of breath.  No reported abdominal pain or diarrhea.  Son does report?  Falls recently.  Per the son, has fairly chronic generalized confusion though this is seemed to acutely worsen. Presented to the ER afebrile, hemodynamically stable.  Satting well on room air.  White count 8.6, hemoglobin 7.6, platelets 159, creatinine 1.41, urinalysis negative for infection, INR 2.3.  CT head, CT C-spine, CT chest abdomen pelvis all grossly within normal limits.  Hemoccult negative. Review of Systems: As mentioned in the history of present illness. All other systems reviewed and are negative. Past Medical History:  Diagnosis Date   Dementia (HCC)    History reviewed. No pertinent surgical history. Social History:  reports that he has an unknown smoking status. He has never used smokeless tobacco. He reports that he does not currently use alcohol. He reports that he does not currently use drugs.  No Known Allergies  History reviewed. No pertinent family history.  Prior to Admission medications   Medication Sig Start Date End Date Taking?  Authorizing Provider  acetaminophen (TYLENOL) 325 MG tablet Take 650 mg by mouth every 4 (four) hours as needed.    [provider]  ascorbic acid (VITAMIN C) 250 MG tablet Take 250 mg by mouth daily.    [provider]  azithromycin (ZITHROMAX Z-PAK) 250 MG tablet Take 2 tablets (500 mg) on  Day 1,  followed by 1 tablet (250 mg) once daily on Days 2 through 5. 04/12/21   Sharman Cheek, MD  Calcium Carbonate 500 MG CHEW Chew by mouth every 3 (three) hours as needed.    [provider]  cefdinir (OMNICEF) 300 MG capsule Take 1 capsule (300 mg total) by mouth 2 (two) times daily. 04/12/21   Sharman Cheek, MD  Cholecalciferol (VITAMIN D3) 10 MCG (400 UNIT) CAPS Take by mouth daily.    [provider]  docusate sodium (COLACE) 100 MG capsule Take 100 mg by mouth daily.    [provider]  donepezil (ARICEPT) 10 MG tablet Take 10 mg by mouth daily. 09/16/20   [provider]  levofloxacin (LEVAQUIN) 500 MG tablet Take 500 mg by mouth daily. Patient not taking: Reported on 01/16/2021 09/18/20   [provider]  Multiple Vitamin (MULTI VITAMIN PO) Take by mouth daily.    [provider]  ondansetron (ZOFRAN ODT) 4 MG disintegrating tablet Take 1 tablet (4 mg total) by mouth every 8 (eight) hours as needed for nausea or vomiting. 04/12/21   Sharman Cheek, MD  saccharomyces boulardii (FLORASTOR) 250 MG capsule Take 250 mg by mouth 2 (two) times daily. Patient not taking: Reported on 01/16/2021    [provider]  sertraline (ZOLOFT) 25 MG tablet Take 25 mg by mouth daily. 09/16/20   [provider]  Thiamine HCl (THIAMINE PO) Take by mouth daily.    [provider]  warfarin (COUMADIN) 1 MG tablet Take 0.5 mg by mouth daily.    [provider]  warfarin (COUMADIN) 5 MG tablet Take 5 mg by mouth daily. Patient not taking: No sig reported 09/24/20   [provider]  warfarin (COUMADIN) 6  MG tablet Take 6 mg by mouth daily. 01/13/21   [provider]  zinc sulfate 220 (50 Zn) MG capsule Take 220 mg by mouth daily.    [provider]    Physical Exam: Vitals:   07/21/23 0500 07/21/23 0507 07/21/23 0645 07/21/23 0900  BP: (!) 129/46 (!) 129/46 (!) 148/52 (!) 136/56  Pulse: 61 61 (!) 57 (!) 56  Resp: 12 14 15 12   Temp:  97.6 F (36.4 C) (!) 96.6 F (35.9 C)   TempSrc:  Axillary Axillary   SpO2: 100% 100% 100% 100%   Physical Exam Constitutional:      Appearance: He is normal weight.  HENT:     Head: Normocephalic and atraumatic.     Mouth/Throat:     Mouth: Mucous membranes are dry.  Eyes:     Pupils: Pupils are equal, round, and reactive to light.  Cardiovascular:     Rate and Rhythm: Normal rate and regular rhythm.  Pulmonary:     Effort: Pulmonary effort is normal.  Abdominal:     General: Bowel sounds are normal.  Skin:    General: Skin is dry.  Neurological:     Comments: + generalized lethargy    Psychiatric:        Mood and Affect: Mood normal.     Data Reviewed:  There are no new results to review at this time.  CT CHEST ABDOMEN PELVIS W CONTRAST CLINICAL DATA:  Polytrauma, blunt  EXAM: CT CHEST, ABDOMEN, AND PELVIS WITH CONTRAST  TECHNIQUE: Multidetector CT imaging of the chest, abdomen and pelvis was performed following the standard protocol during bolus administration of intravenous contrast.  RADIATION DOSE REDUCTION: This exam was performed according to the departmental dose-optimization program which includes automated exposure control, adjustment of the mA and/or kV according to patient size and/or use of iterative reconstruction technique.  CONTRAST:  OMNIPAQUE IOHEXOL 300 MG/ML  SOLN  COMPARISON:  04/12/2021  FINDINGS: CT CHEST FINDINGS  Cardiovascular: Heart is normal size. Diffuse coronary artery and aortic atherosclerosis. No aneurysm or dissection. No filling defects in the pulmonary  arteries to suggest pulmonary emboli.  Mediastinum/Nodes: No mediastinal, hilar, or axillary adenopathy. Trachea and esophagus are unremarkable. Thyroid unremarkable.  Lungs/Pleura: Peripheral interstitial thickening with a basilar predominance most compatible with fibrosis. No acute confluent opacities. No effusions or pneumothorax.  Musculoskeletal: Chest wall soft tissues are unremarkable. No acute bony abnormality.  CT ABDOMEN PELVIS FINDINGS  Hepatobiliary: No focal hepatic abnormality. Gallbladder unremarkable.  Pancreas: No focal abnormality or ductal dilatation.  Spleen: No focal abnormality.  Normal size.  Adrenals/Urinary Tract: No adrenal abnormality. No focal renal abnormality. No stones or hydronephrosis. Urinary bladder is unremarkable.  Stomach/Bowel: Stomach, large and small bowel grossly unremarkable.  Vascular/Lymphatic: Aorta iliac atherosclerosis. No evidence of aneurysm or adenopathy.  Reproductive: Prominent prostate  Other: No free fluid or free air.  Musculoskeletal: No acute bony abnormality.  IMPRESSION: No acute findings in the chest, abdomen or pelvis.  Coronary artery disease, aortic atherosclerosis.  Chronic lung disease/fibrosis.  Prostate enlargement.  Electronically Signed   By: Charlett Nose M.D.   On: 07/21/2023 03:23 CT Cervical Spine Wo Contrast CLINICAL DATA:  Syncope, cough  EXAM: CT HEAD WITHOUT CONTRAST  CT CERVICAL SPINE WITHOUT CONTRAST  TECHNIQUE: Multidetector CT imaging of the head and cervical spine was performed following the standard protocol without intravenous contrast. Multiplanar CT image reconstructions of the cervical spine were also generated.  RADIATION DOSE REDUCTION: This exam was performed according to the departmental dose-optimization program which includes automated exposure control, adjustment of the mA and/or kV according to patient size and/or use of iterative reconstruction  technique.  COMPARISON:  CT head dated 05/10/2023. CT cervical spine dated 08/09/2022.  FINDINGS: CT HEAD FINDINGS  Brain: No evidence of acute infarction, hemorrhage, extra-axial collection or mass lesion/mass effect.  Global cortical and central atrophy with prominent extra-axial CSF space. Secondary ventriculomegaly.  Subcortical white matter and periventricular small vessel ischemic changes.  Vascular: Intracranial atherosclerosis.  Skull: Normal. Negative for fracture or focal lesion.  Sinuses/Orbits: The visualized paranasal sinuses are essentially clear. The mastoid air cells are unopacified.  Other: None.  CT CERVICAL SPINE FINDINGS  Alignment: Normal cervical lordosis.  Skull base and vertebrae: No acute fracture. No primary bone lesion or focal pathologic process.  Soft tissues and spinal canal: No prevertebral fluid or swelling. No visible canal hematoma.  Disc levels: Mild degenerative changes most prominent C6-7. Spinal canal is patent.  Upper chest: Visualized lung apices are notable for biapical pleural-parenchymal scarring.  Other: None.  IMPRESSION: No acute intracranial abnormality. Atrophy with small vessel ischemic changes.  No traumatic injury to the cervical spine. Mild degenerative changes.  Electronically Signed   By: Charline Bills M.D.   On: 07/21/2023 00:56 CT HEAD WO CONTRAST ( ) CLINICAL DATA:  Syncope, cough  EXAM: CT HEAD WITHOUT CONTRAST  CT CERVICAL SPINE WITHOUT CONTRAST  TECHNIQUE: Multidetector CT imaging of the head and cervical spine was performed following the standard protocol without intravenous contrast. Multiplanar CT image reconstructions of the cervical spine were also generated.  RADIATION DOSE REDUCTION: This exam was performed according to the departmental dose-optimization program which includes automated exposure control, adjustment of the mA and/or kV according to patient size and/or use of  iterative reconstruction technique.  COMPARISON:  CT head dated 05/10/2023. CT cervical spine dated 08/09/2022.  FINDINGS: CT HEAD FINDINGS  Brain: No evidence of acute infarction, hemorrhage, extra-axial collection or mass lesion/mass effect.  Global cortical and central atrophy with prominent extra-axial CSF space. Secondary ventriculomegaly.  Subcortical white matter and periventricular small vessel ischemic changes.  Vascular: Intracranial atherosclerosis.  Skull: Normal. Negative for fracture or focal lesion.  Sinuses/Orbits: The visualized paranasal sinuses are essentially clear. The mastoid air cells are unopacified.  Other: None.  CT CERVICAL SPINE FINDINGS  Alignment: Normal cervical lordosis.  Skull base and vertebrae: No acute fracture. No primary bone lesion or focal pathologic process.  Soft tissues and spinal canal: No prevertebral fluid or swelling. No visible canal hematoma.  Disc levels: Mild degenerative changes most prominent C6-7. Spinal canal is patent.  Upper chest: Visualized lung apices are notable for biapical pleural-parenchymal scarring.  Other: None.  IMPRESSION: No acute intracranial abnormality. Atrophy with small vessel ischemic changes.  No traumatic injury to the cervical spine. Mild degenerative changes.  Electronically Signed   By: Charline Bills M.D.   On: 07/21/2023 00:56  Lab Results  Component Value Date   WBC 8.6 07/21/2023   HGB 7.6 (L)  07/21/2023   HCT 22.6 (L) 07/21/2023   MCV 100.9 (H) 07/21/2023   PLT 159 07/21/2023   Last metabolic panel Lab Results  Component Value Date   GLUCOSE 104 (H) 07/21/2023   NA 139 07/21/2023   K 3.6 07/21/2023   CL 104 07/21/2023   CO2 26 07/21/2023   BUN 26 (H) 07/21/2023   CREATININE 1.41 (H) 07/21/2023   GFRNONAA 45 (L) 07/21/2023   CALCIUM 8.3 (L) 07/21/2023   PROT 6.2 (L) 07/21/2023   ALBUMIN 3.2 (L) 07/21/2023   BILITOT 0.9 07/21/2023   ALKPHOS 75 07/21/2023    AST 22 07/21/2023   ALT 17 07/21/2023   ANIONGAP 9 07/21/2023    Assessment and Plan: Encephalopathy Dementia  Progressive worsening confusion in setting of end stage dementia with ? Acute decompensation  CT imaging grossly WNL  No overt infection noted- UA WNL  Clinically dry  Noted decompensation in baseline hgb w/ coumadin use (hemoccult negative)  Monitor hgb  IVF hydration  Prn medication for agitation  Follow    CKD (chronic kidney disease) stage 3, GFR 30-59 ml/min (HCC) Cr 1.4 w/ GFR in 40s  Near baseline though clinically dry  IVF hydration  Monitor    H/O prosthetic heart valve Baseline prosthetic valve- on coumadin  INR 2.3  Will hold for now given downtrending hgb  Follow closely  Anemia Hgb 7.6 today w/ baseline hgb around 10-11  Noted coumadin use in setting of mechanical aortic valve  Hemoccult negative  S/p 1u pRBC  Will monitor hgb  Transfuse for hgb <7  Consider tagged RBC scan as appropriate  Palliative care consulted         Advance Care Planning:   Code Status: Full Code   Consults: Palliative care consulted   Family Communication: Case discussed w/ son over the phone   Severity of Illness: The appropriate patient status for this patient is OBSERVATION. Observation status is judged to be reasonable and necessary in order to provide the required intensity of service to ensure the patient's safety. The patient's presenting symptoms, physical exam findings, and initial radiographic and laboratory data in the context of their medical condition is felt to place them at decreased risk for further clinical deterioration. Furthermore, it is anticipated that the patient will be medically stable for discharge from the hospital within 2 midnights of admission.   Author: Floydene Flock, MD 07/21/2023 10:29 AM  For on call review [email or web address].

## 2023-07-21 NOTE — Consult Note (Addendum)
 Consultation Note Date: 07/21/2023   Patient Name: Luis Doyle  DOB: July 04, 1922  MRN: [id number]  Age / Sex: 89 y.o., male  PCP: Angela Cox, MD (Inactive) Referring Physician: Hannah Beat, MD  Reason for Consultation: Establishing goals of care  HPI/Patient Profile: PER H&P: "Thang Flett is a 89 y.o. 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  Limited history in the setting of encephalopathy.  Per report, patient with progressive agitation and confusion at memory care unit.  Noted to be significantly confused and talking to the son yesterday.  Per report, patient with severe agitation with patient being aggressive toward other patients in the memory unit.  No reported fevers or chills.  No reported nausea or vomiting.  No reports of cough or shortness of breath.  No reported abdominal pain or diarrhea.  Son does report?  Falls recently.  Per the son, has fairly chronic generalized confusion though this is seemed to acutely worsen".   Clinical Assessment and Goals of Care: Notes and labs reviewed. In to see patient, he is resting in bed with no family at bedside. He opens his eyes briefly upon touching his arm while helping to cover him up. He looks at me but does not try to speak.  Stepped out to call patient's son Greggory Stallion. He states at baseline his father walks to the main room for meals at his facility. He is confused and lives on a dementia unit.  He advises his father still toilets himself.  He discusses that his father and mother used to have a camper and would go to [place] in the winter in [place] in the summer.  He states his mother died around 3 years ago.    He states his father seemed to have a day recently where he began asking questions about his camper, his wife, and other things from his past,  which is not normal for him. He states after this, his father declined and became agitated.  We discussed the updates that have been provided to him.   We discussed his diagnoses, prognosis, GOC, EOL wishes disposition and options.  A detailed discussion was had today regarding advanced directives.  Concepts specific to code status, artifical feeding and hydration, IV antibiotics and rehospitalization were discussed.  The difference between an aggressive medical intervention path and a comfort care path was discussed.  Values and goals of care important to patient and family were attempted to be elicited.  Discussed limitations of medical interventions to prolong quality of life in some situations and discussed the concept of human mortality.  We discussed MOST form completed in 2022. He states code status was discussed earlier today by a provider who called him. He states with giving it thought since that conversation, he has decided he would not want CPR. He shares he wants his father to be happy and comfortable for what time he has left.  He states he does not want him to suffer.  He states he would like to switch his father to comfort care. He states he would not want any further lab work or painful procedures; he would not want CPR or ventilator support. He states he does not want his father returned to the hospital in the future. He indicates he will consider the use of antibiotics should an infection occur. He states he sees hospice liaisons at his father's facility, and would like hospice to follow there through end of life.  I completed a MOST form today electronically through [employer]. Each section of options on the form were reviewed in full detail and any questions were answered as needed. The patient's son outlined their wishes for the following treatment decisions:  Cardiopulmonary Resuscitation: Do Not Attempt Resuscitation (DNR/No CPR)  Medical Interventions: Comfort Measures: Keep clean,  warm, and dry. Use medication by any route, positioning, wound care, and other measures to relieve pain and suffering. Use oxygen, suction and manual treatment of airway obstruction as needed for comfort. Do not transfer to the hospital unless comfort needs cannot be met in current location.  Antibiotics: Determine use of limitation of antibiotics when infection occurs  IV Fluids: No IV fluids (provide other measures to ensure comfort)  Feeding Tube: No feeding tube          SUMMARY OF RECOMMENDATIONS    Comfort focused care.  Return to facility with hospice to follow.   Prognosis:  Poor overall      Primary Diagnoses: Present on Admission:  Altered mental status   I have reviewed the medical record, interviewed the patient and family, and examined the patient. The following aspects are pertinent.  Past Medical History:  Diagnosis Date   Dementia [facility])    Social History   Socioeconomic History   Marital status: Widowed    Spouse name: Not on file   Number of children: Not on file   Years of education: Not on file   Highest education level: Not on file  Occupational History   Not on file  Tobacco Use   Smoking status: Unknown   Smokeless tobacco: Never  Vaping Use   Vaping status: Never Used  Substance and Sexual Activity   Alcohol use: Not Currently   Drug use: Not Currently   Sexual activity: Not Currently  Other Topics Concern   Not on file  Social History Narrative   Not on file   Social Drivers of Health   Financial Resource Strain: Not on file  Food Insecurity: Not on file  Transportation Needs: Not on file  Physical Activity: Not on file  Stress: Not on file  Social Connections: Not on file   History reviewed. No pertinent family history. Scheduled Meds: Continuous Infusions:  sodium chloride 75 mL/hr at 07/21/23 1105   PRN Meds:.ondansetron **OR** ondansetron (ZOFRAN) IV Medications Prior to Admission:  Prior to Admission medications    Medication Sig Start Date End Date Taking? Authorizing Provider  acetaminophen (TYLENOL) 325 MG tablet Take 650 mg by mouth every 4 (four) hours as needed.   Yes [provider]  ALPRAZolam (XANAX) 0.25 MG tablet Take 0.25 mg by mouth. 1/2 tablet Monday, Wednesday, and friday 30 minutes prior to personal care 03/23/23  Yes [provider]  Calcium Carbonate 500 MG CHEW Chew by mouth every 3 (three) hours as needed.   Yes [provider]  Cholecalciferol (VITAMIN D3) 10 MCG (400 UNIT) CAPS Take by mouth daily.   Yes [provider]  docusate sodium (COLACE) 100  MG capsule Take 100 mg by mouth daily.   Yes [provider]  donepezil (ARICEPT) 10 MG tablet Take 10 mg by mouth daily. 09/16/20  Yes [provider]  loperamide (IMODIUM) 2 MG capsule Take 2 mg by mouth as needed for diarrhea or loose stools. 07/07/23  Yes [provider]  Multiple Vitamin (MULTI VITAMIN PO) Take by mouth daily.   Yes [provider]  sertraline (ZOLOFT) 25 MG tablet Take 25 mg by mouth daily. 09/16/20  Yes [provider]  Thiamine HCl (THIAMINE PO) Take by mouth daily.   Yes [provider]  warfarin (COUMADIN) 2 MG tablet Take 2 mg by mouth daily. 06/22/23  Yes [provider]  warfarin (COUMADIN) 4 MG tablet Take 4 mg by mouth daily. 06/22/23  Yes [provider]  ascorbic acid (VITAMIN C) 250 MG tablet Take 250 mg by mouth daily. Patient not taking: Reported on 07/21/2023    [provider]  azithromycin (ZITHROMAX Z-PAK) 250 MG tablet Take 2 tablets (500 mg) on  Day 1,  followed by 1 tablet (250 mg) once daily on Days 2 through 5. Patient not taking: Reported on 07/21/2023 04/12/21   Sharman Cheek, MD  cefdinir (OMNICEF) 300 MG capsule Take 1 capsule (300 mg total) by mouth 2 (two) times daily. Patient not taking: Reported on 07/21/2023 04/12/21   Sharman Cheek, MD  levofloxacin (LEVAQUIN) 500 MG  tablet Take 500 mg by mouth daily. Patient not taking: Reported on 01/16/2021 09/18/20   [provider]  ondansetron (ZOFRAN ODT) 4 MG disintegrating tablet Take 1 tablet (4 mg total) by mouth every 8 (eight) hours as needed for nausea or vomiting. Patient not taking: Reported on 07/21/2023 04/12/21   Sharman Cheek, MD  saccharomyces boulardii (FLORASTOR) 250 MG capsule Take 250 mg by mouth 2 (two) times daily. Patient not taking: Reported on 01/16/2021    [provider]  warfarin (COUMADIN) 1 MG tablet Take 0.5 mg by mouth daily. Patient not taking: Reported on 07/21/2023    [provider]  warfarin (COUMADIN) 5 MG tablet Take 5 mg by mouth daily. Patient not taking: Reported on 01/16/2021 09/24/20   [provider]  warfarin (COUMADIN) 6 MG tablet Take 6 mg by mouth daily. Patient not taking: Reported on 07/21/2023 01/13/21   [provider]  zinc sulfate 220 (50 Zn) MG capsule Take 220 mg by mouth daily. Patient not taking: Reported on 07/21/2023    [provider]   No Known Allergies Review of Systems  Unable to perform ROS   Physical Exam Constitutional:      Comments: Awoke to touch. Does not speak to me.     Vital Signs: BP (!) 145/50   Pulse (!) 51   Temp (!) 97 F (36.1 C) (Axillary)   Resp 13   SpO2 100%  Pain Scale: PAINAD       SpO2: SpO2: 100 % O2 Device:SpO2: 100 % O2 Flow Rate: .   IO: Intake/output summary:  Intake/Output Summary (Last 24 hours) at 07/21/2023 1544 Last data filed at 07/21/2023 0429 Gross per 24 hour  Intake 360 ml  Output --  Net 360 ml    LBM:   Baseline Weight:   Most recent weight:         Signed by: Morton Stall, NP   Please contact Palliative Medicine Team phone at [phone] for questions and concerns.  For individual provider: See Loretha Stapler

## 2023-07-21 NOTE — Assessment & Plan Note (Signed)
 Baseline prosthetic valve- on coumadin  INR 2.3  Will hold for now given downtrending hgb  Follow closely

## 2023-07-21 NOTE — ED Notes (Signed)
 This NT assisted Nurse Dorian in changing patient. Patient was wet. Patient has on a clean brief, a clean chux and a clean gown on. Posey alarm is on.

## 2023-07-22 DIAGNOSIS — D649 Anemia, unspecified: Secondary | ICD-10-CM | POA: Diagnosis not present

## 2023-07-22 DIAGNOSIS — R451 Restlessness and agitation: Secondary | ICD-10-CM

## 2023-07-22 DIAGNOSIS — Z515 Encounter for palliative care: Secondary | ICD-10-CM

## 2023-07-22 DIAGNOSIS — R4182 Altered mental status, unspecified: Secondary | ICD-10-CM | POA: Diagnosis not present

## 2023-07-22 MED ORDER — HALOPERIDOL 0.5 MG PO TABS: 0.5000 mg | ORAL_TABLET | ORAL | 0 refills | Status: DC | PRN

## 2023-07-22 MED ORDER — MORPHINE SULFATE (CONCENTRATE) 10 MG /0.5 ML PO SOLN: 5.0000 mg | ORAL | 0 refills | Status: DC | PRN

## 2023-07-22 MED ORDER — ALPRAZOLAM 0.25 MG PO TABS: 0.2500 mg | ORAL_TABLET | Freq: Two times a day (BID) | ORAL | 0 refills | Status: DC | PRN

## 2023-07-22 NOTE — TOC Transition Note (Signed)
 Transition of Care [facility]) - Discharge Note   Patient Details  Name: Luis Doyle MRN: [id number] Date of Birth: 19-Sep-1922  [facility] of [facility] CM/SW Contact:  Erin Sons, LCSW Phone Number: 07/22/2023, 3:42 PM   Clinical Narrative:     CSW spoke with pt son Greggory Stallion and confirmed plan for pt to return to [facility] with Hospice care. He chooses [facility] who [facility] is contracted with. CSW made referral with [facility]. Confirmed with [facility] that pt can return today. DC summary and fl2 faxed to [facility]. Non-emergency ambulance called for next available transport. RN to call report to [phone]  Final next level of care: Memory Care Barriers to Discharge: No Barriers Identified          Discharge Plan and Services Additional resources added to the After Visit Summary for                                       Social Drivers of Health (SDOH) Interventions SDOH Screenings   Food Insecurity: No Food Insecurity (07/22/2023)  Housing: Low Risk  (07/22/2023)  Transportation Needs: No Transportation Needs (07/22/2023)  Utilities: Not At Risk (07/22/2023)  Depression (PHQ2-9): Low Risk  (09/27/2020)  Social Connections: Moderately Isolated (07/22/2023)  Tobacco Use: Unknown (07/21/2023)     Readmission Risk Interventions     No data to display

## 2023-07-22 NOTE — [place] FL2 (Addendum)
 [place] MEDICAID FL2 LEVEL OF CARE FORM     IDENTIFICATION  Patient Name: Luis Doyle Birthdate: 08/02/1922 Sex: male Admission Date (Current Location): 07/20/2023  [place] and [place] Number:  Chiropodist and Address:  [facility], [address] [place], [place] [address]      Provider Number: [id number]  Attending Physician Name and Address:  Kathrynn Running, MD  Relative Name and Phone Number:       Current Level of Care: Hospital Recommended Level of Care: SCU Prior Approval Number:    Date Approved/Denied:   PASRR Number:    Discharge Plan: SCU    Current Diagnoses: Patient Active Problem List   Diagnosis Date Noted   Altered mental status 07/21/2023   Encephalopathy 07/21/2023   Anemia 07/21/2023   H/O prosthetic heart valve 07/21/2023   CKD (chronic kidney disease) stage 3, GFR 30-59 ml/min (HCC) 07/21/2023   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 09/20/2020    Orientation RESPIRATION BLADDER Height & Weight     Self  Normal Incontinent Weight:   Height:     BEHAVIORAL SYMPTOMS/MOOD NEUROLOGICAL BOWEL NUTRITION STATUS      Incontinent Diet (REgular)  AMBULATORY STATUS COMMUNICATION OF NEEDS Skin   Extensive Assist Verbally Other (Comment) (Skin tear right arm, skin tear left arm, skin tear pretibial left)                       Personal Care Assistance Level of Assistance  Bathing, Feeding, Dressing Bathing Assistance: Maximum assistance Feeding assistance: Independent Dressing Assistance: Limited assistance     Functional Limitations Info  Sight, Speech, Hearing Sight Info: Adequate Hearing Info: Adequate Speech Info: Impaired    SPECIAL CARE FACTORS FREQUENCY                       Contractures Contractures Info: Not present    Additional Factors Info  Code Status, Allergies Code Status Info: DNR Allergies Info: no known  allergies            Discharge Medications: STOP taking these medications     ascorbic acid 250 MG tablet Commonly known as: VITAMIN C    azithromycin 250 MG tablet Commonly known as: Zithromax Z-Pak    Calcium Carbonate 500 MG Chew    cefdinir 300 MG capsule Commonly known as: OMNICEF    donepezil 10 MG tablet Commonly known as: ARICEPT    levofloxacin 500 MG tablet Commonly known as: LEVAQUIN    MULTI VITAMIN PO    saccharomyces boulardii 250 MG capsule Commonly known as: FLORASTOR    THIAMINE PO    Vitamin D3 10 MCG (400 UNIT) Caps    warfarin 1 MG tablet Commonly known as: COUMADIN    warfarin 2 MG tablet Commonly known as: COUMADIN    warfarin 4 MG tablet Commonly known as: COUMADIN    warfarin 5 MG tablet Commonly known as: COUMADIN    warfarin 6 MG tablet Commonly known as: COUMADIN    zinc sulfate (50mg  elemental zinc) 220 (50 Zn) MG capsule           TAKE these medications     acetaminophen 325 MG tablet Commonly known as: TYLENOL Take 650 mg by mouth every 4 (four) hours as needed.    ALPRAZolam 0.25 MG tablet Commonly known as: XANAX Take 1 tablet (0.25 mg total) by mouth 2 (two) times daily as needed  for anxiety. 1/2 tablet Monday, Wednesday, and friday 30 minutes prior to personal care What changed:  when to take this reasons to take this    docusate sodium 100 MG capsule Commonly known as: COLACE Take 100 mg by mouth daily.    haloperidol 0.5 MG tablet Commonly known as: HALDOL Take 1 tablet (0.5 mg total) by mouth every 4 (four) hours as needed for agitation (or delirium).    loperamide 2 MG capsule Commonly known as: IMODIUM Take 2 mg by mouth as needed for diarrhea or loose stools.    morphine CONCENTRATE 10 mg / 0.5 ml concentrated solution Place 0.25 mLs (5 mg total) under the tongue every 2 (two) hours as needed for moderate pain (pain score 4-6) (or dyspnea).    ondansetron 4 MG disintegrating tablet Commonly  known as: Zofran ODT Take 1 tablet (4 mg total) by mouth every 8 (eight) hours as needed for nausea or vomiting.    sertraline 25 MG tablet Commonly known as: ZOLOFT Take 25 mg by mouth daily.      Relevant Imaging Results:  Relevant Lab Results:   Additional Information SSN [id number] [place], [place]

## 2023-07-22 NOTE — Care Management Obs Status (Signed)
 MEDICARE OBSERVATION STATUS NOTIFICATION   Patient Details  Name: Luis Doyle MRN: [id number] Date of Birth: December 11, 1922   Medicare Observation Status Notification Given:  Orland Dec, CMA 07/22/2023, 2:49 PM

## 2023-07-22 NOTE — Progress Notes (Signed)
 Palliative Care Progress Note, Assessment & Plan   Patient Name: Luis Doyle       Date: 07/22/2023 DOB: [birth year]  Age: 89 y.o. MRN#: [id number] Attending Physician: Kathrynn Running, MD Primary Care Physician: Smiley Houseman, NP Admit Date: 07/20/2023  Subjective: Patient is lying in bed in no apparent distress.  He acknowledges my presence and is able to make his wishes known.  He is saying he needs to urinate.  No family or friends present during my visit.  HPI: From H&P: Suraj Ramdass is a 89 y.o. 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  Limited history in the setting of encephalopathy.  Per report, patient with progressive agitation and confusion at memory care unit.  Noted to be significantly confused and talking to the son yesterday.  Per report, patient with severe agitation with patient being aggressive toward other patients in the memory unit.  No reported fevers or chills.  No reported nausea or vomiting.  No reports of cough or shortness of breath.  No reported abdominal pain or diarrhea.  Son does report?  Falls recently.  Per the son, has fairly chronic generalized confusion though this is seemed to acutely worsen   Summary of counseling/coordination of care: Extensive chart review completed prior to meeting patient including labs, vital signs, imaging, progress notes, orders, and available advanced directive documents from current and previous encounters.   After reviewing the patient's chart and assessing the patient at bedside, I spoke with patient in regards to symptom management and goals of care.   Patient says he needs to go the bathroom "right now".  Urinal provided and assistance given  for patient to urinate. He made 100cc of dark yellow urine. RN notified.   Symptoms assessed.  Patient endorses relief after using urinal.  He shares that he has been wanting to do that for about an hour and had no relief.  He has no other acute complaints at this time.  Denies headache, chest pain, and other acute illness.  No adjustment to [facility] needed.  Patient asking for breakfast - no diet ordered.   Therapeutic silence and active listening provided. No adjustment to [facility] at this time.   Plan remains for patient to transfer to [facility] with hospice services to follow.  No acute palliative needs at this time. PMT remains available to patient and family throughout his hospitalization.   Physical Exam Constitutional:      Comments: Thin, frail  HENT:     Head: Normocephalic.     Nose: Nose normal.     Mouth/Throat:     Mouth: Mucous membranes are dry.  Eyes:     Pupils: Pupils are equal, round, and reactive to light.  Pulmonary:     Effort: Pulmonary effort is normal.  Abdominal:     Palpations: Abdomen is soft.  Neurological:     Mental Status: He is alert.     Comments: Oriented to self  Psychiatric:        Behavior: Behavior normal.             Total Time 35 minutes   Time spent includes: Detailed  review of medical records (labs, imaging, vital signs), medically appropriate exam (mental status, respiratory, cardiac, skin), discussed with treatment team, counseling and educating patient, family and staff, documenting clinical information, medication management and coordination of care.  Samara Deist L. Bonita Quin, DNP, FNP-BC Palliative Medicine Team

## 2023-07-22 NOTE — Plan of Care (Signed)

## 2023-07-22 NOTE — Progress Notes (Addendum)
 [facility] Liaison Note  Received a referral from [facility], Laurette Schimke, [place] to arrange Hospice services for patient at [facility]. Spoke with patient's son today to review Hospice services. Son agreeable to Hospice follow up.    Son reported that patient has a wheeled walker with a seat.  He asked that the nurse from Hospice assess for additional DME needs.  Referral submitted today.    Please call with any Hospice related questions or concerns.  Thank you for the opportunity to participate in this patient's care  [facility] Liaison [phone]

## 2023-07-22 NOTE — Discharge Summary (Signed)
 Luis Doyle WUJ:[id number] DOB: 1923/04/30 DOA: 07/20/2023  PCP: Smiley Houseman, NP  Admit date: 07/20/2023 Discharge date: 07/22/2023  Time spent: 35 minutes  Recommendations for Outpatient Follow-up:  Establishing with outpatient hospice     Discharge Diagnoses:  Principal Problem:   Altered mental status Active Problems:   Encephalopathy   Anemia   H/O prosthetic heart valve   CKD (chronic kidney disease) stage 3, GFR 30-59 ml/min (HCC)   Discharge Condition: stable  Diet recommendation: ad lib  There were no vitals filed for this visit.  History of present illness:  From admission h and p Luis Doyle is a 89 y.o. 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  Limited history in the setting of encephalopathy.  Per report, patient with progressive agitation and confusion at memory care unit.  Noted to be significantly confused and talking to the son yesterday.  Per report, patient with severe agitation with patient being aggressive toward other patients in the memory unit.  No reported fevers or chills.  No reported nausea or vomiting.  No reports of cough or shortness of breath.  No reported abdominal pain or diarrhea.  Son does report?  Falls recently.  Per the son, has fairly chronic generalized confusion though this is seemed to acutely worsen.   Hospital Course:  Hx dementia, heart valve on coumadin, resides in memory care unit, presenting with worsening agitation and confusion. No report of bleeding but anemic with hgb in the 7s, transfused one unit. Palliative care consulted. Son has decided to transition to full comfort care. Hospice is engaged and will follow the patient at his memory care unit.   Procedures: Blood transfusion   Consultations: palliative  Discharge Exam: Vitals:   07/22/23 0035 07/22/23 0738  BP: (!) 142/71 133/71  Pulse: 64 71  Resp: 17 12  Temp: (!)  97.3 F (36.3 C) (!) 97.5 F (36.4 C)  SpO2: 100% 99%    General: NAD, confused Cardiovascular: rrr, systolic murmur Respiratory: normal wob Ext: warm  Discharge Instructions   Discharge Instructions     Diet general   Complete by: As directed    Increase activity slowly   Complete by: As directed    No wound care   Complete by: As directed       Allergies as of 07/22/2023   No Known Allergies      Medication List     STOP taking these medications    ascorbic acid 250 MG tablet Commonly known as: VITAMIN C   azithromycin 250 MG tablet Commonly known as: Zithromax Z-Pak   Calcium Carbonate 500 MG Chew   cefdinir 300 MG capsule Commonly known as: OMNICEF   donepezil 10 MG tablet Commonly known as: ARICEPT   levofloxacin 500 MG tablet Commonly known as: LEVAQUIN   MULTI VITAMIN PO   saccharomyces boulardii 250 MG capsule Commonly known as: FLORASTOR   THIAMINE PO   Vitamin D3 10 MCG (400 UNIT) Caps   warfarin 1 MG tablet Commonly known as: COUMADIN   warfarin 2 MG tablet Commonly known as: COUMADIN   warfarin 4 MG tablet Commonly known as: COUMADIN   warfarin 5 MG tablet Commonly known as: COUMADIN   warfarin 6 MG tablet Commonly known as: COUMADIN   zinc sulfate (50mg  elemental zinc) 220 (50 Zn) MG capsule       TAKE these medications    acetaminophen 325 MG tablet Commonly known as: TYLENOL Take  650 mg by mouth every 4 (four) hours as needed.   ALPRAZolam 0.25 MG tablet Commonly known as: XANAX Take 1 tablet (0.25 mg total) by mouth 2 (two) times daily as needed for anxiety. 1/2 tablet Monday, Wednesday, and friday 30 minutes prior to personal care What changed:  when to take this reasons to take this   docusate sodium 100 MG capsule Commonly known as: COLACE Take 100 mg by mouth daily.   haloperidol 0.5 MG tablet Commonly known as: HALDOL Take 1 tablet (0.5 mg total) by mouth every 4 (four) hours as needed for  agitation (or delirium).   loperamide 2 MG capsule Commonly known as: IMODIUM Take 2 mg by mouth as needed for diarrhea or loose stools.   morphine CONCENTRATE 10 mg / 0.5 ml concentrated solution Place 0.25 mLs (5 mg total) under the tongue every 2 (two) hours as needed for moderate pain (pain score 4-6) (or dyspnea).   ondansetron 4 MG disintegrating tablet Commonly known as: Zofran ODT Take 1 tablet (4 mg total) by mouth every 8 (eight) hours as needed for nausea or vomiting.   sertraline 25 MG tablet Commonly known as: ZOLOFT Take 25 mg by mouth daily.       No Known Allergies    The results of significant diagnostics from this hospitalization (including imaging, microbiology, ancillary and laboratory) are listed below for reference.    Significant Diagnostic Studies: CT CHEST ABDOMEN PELVIS W CONTRAST Result Date: 07/21/2023 CLINICAL DATA:  Polytrauma, blunt EXAM: CT CHEST, ABDOMEN, AND PELVIS WITH CONTRAST TECHNIQUE: Multidetector CT imaging of the chest, abdomen and pelvis was performed following the standard protocol during bolus administration of intravenous contrast. RADIATION DOSE REDUCTION: This exam was performed according to the departmental dose-optimization program which includes automated exposure control, adjustment of the mA and/or kV according to patient size and/or use of iterative reconstruction technique. CONTRAST:  OMNIPAQUE IOHEXOL 300 MG/ML  SOLN COMPARISON:  04/12/2021 FINDINGS: CT CHEST FINDINGS Cardiovascular: Heart is normal size. Diffuse coronary artery and aortic atherosclerosis. No aneurysm or dissection. No filling defects in the pulmonary arteries to suggest pulmonary emboli. Mediastinum/Nodes: No mediastinal, hilar, or axillary adenopathy. Trachea and esophagus are unremarkable. Thyroid unremarkable. Lungs/Pleura: Peripheral interstitial thickening with a basilar predominance most compatible with fibrosis. No acute confluent opacities. No  effusions or pneumothorax. Musculoskeletal: Chest wall soft tissues are unremarkable. No acute bony abnormality. CT ABDOMEN PELVIS FINDINGS Hepatobiliary: No focal hepatic abnormality. Gallbladder unremarkable. Pancreas: No focal abnormality or ductal dilatation. Spleen: No focal abnormality.  Normal size. Adrenals/Urinary Tract: No adrenal abnormality. No focal renal abnormality. No stones or hydronephrosis. Urinary bladder is unremarkable. Stomach/Bowel: Stomach, large and small bowel grossly unremarkable. Vascular/Lymphatic: Aorta iliac atherosclerosis. No evidence of aneurysm or adenopathy. Reproductive: Prominent prostate Other: No free fluid or free air. Musculoskeletal: No acute bony abnormality. IMPRESSION: No acute findings in the chest, abdomen or pelvis. Coronary artery disease, aortic atherosclerosis. Chronic lung disease/fibrosis. Prostate enlargement. Electronically Signed   By: Charlett Nose M.D.   On: 07/21/2023 03:23   CT HEAD WO CONTRAST ( ) Result Date: 07/21/2023 CLINICAL DATA:  Syncope, cough EXAM: CT HEAD WITHOUT CONTRAST CT CERVICAL SPINE WITHOUT CONTRAST TECHNIQUE: Multidetector CT imaging of the head and cervical spine was performed following the standard protocol without intravenous contrast. Multiplanar CT image reconstructions of the cervical spine were also generated. RADIATION DOSE REDUCTION: This exam was performed according to the departmental dose-optimization program which includes automated exposure control, adjustment of the mA and/or kV according to  patient size and/or use of iterative reconstruction technique. COMPARISON:  CT head dated 05/10/2023. CT cervical spine dated 08/09/2022. FINDINGS: CT HEAD FINDINGS Brain: No evidence of acute infarction, hemorrhage, extra-axial collection or mass lesion/mass effect. Global cortical and central atrophy with prominent extra-axial CSF space. Secondary ventriculomegaly. Subcortical white matter and periventricular small vessel  ischemic changes. Vascular: Intracranial atherosclerosis. Skull: Normal. Negative for fracture or focal lesion. Sinuses/Orbits: The visualized paranasal sinuses are essentially clear. The mastoid air cells are unopacified. Other: None. CT CERVICAL SPINE FINDINGS Alignment: Normal cervical lordosis. Skull base and vertebrae: No acute fracture. No primary bone lesion or focal pathologic process. Soft tissues and spinal canal: No prevertebral fluid or swelling. No visible canal hematoma. Disc levels: Mild degenerative changes most prominent C6-7. Spinal canal is patent. Upper chest: Visualized lung apices are notable for biapical pleural-parenchymal scarring. Other: None. IMPRESSION: No acute intracranial abnormality. Atrophy with small vessel ischemic changes. No traumatic injury to the cervical spine. Mild degenerative changes. Electronically Signed   By: Charline Bills M.D.   On: 07/21/2023 00:56   CT Cervical Spine Wo Contrast Result Date: 07/21/2023 CLINICAL DATA:  Syncope, cough EXAM: CT HEAD WITHOUT CONTRAST CT CERVICAL SPINE WITHOUT CONTRAST TECHNIQUE: Multidetector CT imaging of the head and cervical spine was performed following the standard protocol without intravenous contrast. Multiplanar CT image reconstructions of the cervical spine were also generated. RADIATION DOSE REDUCTION: This exam was performed according to the departmental dose-optimization program which includes automated exposure control, adjustment of the mA and/or kV according to patient size and/or use of iterative reconstruction technique. COMPARISON:  CT head dated 05/10/2023. CT cervical spine dated 08/09/2022. FINDINGS: CT HEAD FINDINGS Brain: No evidence of acute infarction, hemorrhage, extra-axial collection or mass lesion/mass effect. Global cortical and central atrophy with prominent extra-axial CSF space. Secondary ventriculomegaly. Subcortical white matter and periventricular small vessel ischemic changes. Vascular:  Intracranial atherosclerosis. Skull: Normal. Negative for fracture or focal lesion. Sinuses/Orbits: The visualized paranasal sinuses are essentially clear. The mastoid air cells are unopacified. Other: None. CT CERVICAL SPINE FINDINGS Alignment: Normal cervical lordosis. Skull base and vertebrae: No acute fracture. No primary bone lesion or focal pathologic process. Soft tissues and spinal canal: No prevertebral fluid or swelling. No visible canal hematoma. Disc levels: Mild degenerative changes most prominent C6-7. Spinal canal is patent. Upper chest: Visualized lung apices are notable for biapical pleural-parenchymal scarring. Other: None. IMPRESSION: No acute intracranial abnormality. Atrophy with small vessel ischemic changes. No traumatic injury to the cervical spine. Mild degenerative changes. Electronically Signed   By: Charline Bills M.D.   On: 07/21/2023 00:56    Microbiology: No results found for this or any previous visit (from the past 240 hours).   Labs: Basic Metabolic Panel: Recent Labs  Lab 07/21/23 0059  NA 139  K 3.6  CL 104  CO2 26  GLUCOSE 104*  BUN 26*  CREATININE 1.41*  CALCIUM 8.3*   Liver Function Tests: Recent Labs  Lab 07/21/23 0059  AST 22  ALT 17  ALKPHOS 75  BILITOT 0.9  PROT 6.2*  ALBUMIN 3.2*   No results for input(s): "LIPASE", "AMYLASE" in the last 168 hours. No results for input(s): "AMMONIA" in the last 168 hours. CBC: Recent Labs  Lab 07/21/23 0059  WBC 8.6  NEUTROABS 6.5  HGB 7.6*  HCT 22.6*  MCV 100.9*  PLT 159   Cardiac Enzymes: No results for input(s): "CKTOTAL", "CKMB", "CKMBINDEX", "TROPONINI" in the last 168 hours. BNP: BNP (last 3 results) No results  for input(s): "BNP" in the last 8760 hours.  ProBNP (last 3 results) No results for input(s): "PROBNP" in the last 8760 hours.  CBG: No results for input(s): "GLUCAP" in the last 168 hours.     Signed:  Silvano Bilis MD.  [facility] 07/22/2023, 11:40 AM

## 2023-07-23 ENCOUNTER — Emergency Department
Admission: EM | Admit: 2023-07-23 | Discharge: 2023-07-23 | Disposition: A | Discharge status: Home / Self Care | Payer: Medicare Other | Attending: Emergency Medicine | Admitting: Emergency Medicine

## 2023-07-23 ENCOUNTER — Emergency Department: Payer: Medicare Other

## 2023-07-23 ENCOUNTER — Other Ambulatory Visit: Payer: Self-pay

## 2023-07-23 DIAGNOSIS — F0390 Unspecified dementia without behavioral disturbance: Secondary | ICD-10-CM | POA: Insufficient documentation

## 2023-07-23 DIAGNOSIS — S51811A Laceration without foreign body of right forearm, initial encounter: Secondary | ICD-10-CM | POA: Insufficient documentation

## 2023-07-23 DIAGNOSIS — X58XXXA Exposure to other specified factors, initial encounter: Secondary | ICD-10-CM | POA: Insufficient documentation

## 2023-07-23 DIAGNOSIS — Z23 Encounter for immunization: Secondary | ICD-10-CM | POA: Diagnosis not present

## 2023-07-23 DIAGNOSIS — S59911A Unspecified injury of right forearm, initial encounter: Secondary | ICD-10-CM | POA: Diagnosis present

## 2023-07-23 LAB — CBC WITH DIFFERENTIAL/PLATELET
Abs Immature Granulocytes: 0.04 10*3/uL (ref 0.00–0.07)
Basophils Absolute: 0.1 10*3/uL (ref 0.0–0.1)
Basophils Relative: 1 %
Eosinophils Absolute: 0.1 10*3/uL (ref 0.0–0.5)
Eosinophils Relative: 1 %
HCT: 29.5 % — ABNORMAL LOW (ref 39.0–52.0)
Hemoglobin: 9.9 g/dL — ABNORMAL LOW (ref 13.0–17.0)
Immature Granulocytes: 1 %
Lymphocytes Relative: 11 %
Lymphs Abs: 0.8 10*3/uL (ref 0.7–4.0)
MCH: 32.8 pg (ref 26.0–34.0)
MCHC: 33.6 g/dL (ref 30.0–36.0)
MCV: 97.7 fL (ref 80.0–100.0)
Monocytes Absolute: 0.6 10*3/uL (ref 0.1–1.0)
Monocytes Relative: 8 %
Neutro Abs: 6.1 10*3/uL (ref 1.7–7.7)
Neutrophils Relative %: 78 %
Platelets: 164 10*3/uL (ref 150–400)
RBC: 3.02 MIL/uL — ABNORMAL LOW (ref 4.22–5.81)
RDW: 19 % — ABNORMAL HIGH (ref 11.5–15.5)
WBC: 7.7 10*3/uL (ref 4.0–10.5)
nRBC: 0 % (ref 0.0–0.2)

## 2023-07-23 LAB — BASIC METABOLIC PANEL
Anion gap: 12 (ref 5–15)
BUN: 25 mg/dL — ABNORMAL HIGH (ref 8–23)
CO2: 24 mmol/L (ref 22–32)
Calcium: 8.6 mg/dL — ABNORMAL LOW (ref 8.9–10.3)
Chloride: 106 mmol/L (ref 98–111)
Creatinine, Ser: 1.2 mg/dL (ref 0.61–1.24)
GFR, Estimated: 54 mL/min — ABNORMAL LOW (ref >60)
Glucose, Bld: 72 mg/dL (ref 70–99)
Potassium: 4.2 mmol/L (ref 3.5–5.1)
Sodium: 142 mmol/L (ref 135–145)

## 2023-07-23 LAB — TYPE AND SCREEN
ABO/RH(D): O POS
Antibody Screen: NEGATIVE
Unit division: 0

## 2023-07-23 LAB — BPAM RBC
Blood Product Expiration Date: 202503242359
ISSUE DATE / TIME: 202502260426
Unit Type and Rh: 5100

## 2023-07-23 LAB — SAMPLE TO BLOOD BANK

## 2023-07-23 MED ORDER — LIDOCAINE HCL (PF) 1 % IJ SOLN
INTRAMUSCULAR | Status: AC
  Filled 2023-07-23: qty 5

## 2023-07-23 MED ORDER — SODIUM CHLORIDE 0.9 % IV BOLUS
1000.0000 mL | Freq: Once | INTRAVENOUS | Status: AC
  Administered 2023-07-23: 1000 mL via INTRAVENOUS

## 2023-07-23 MED ORDER — TETANUS-DIPHTH-ACELL PERTUSSIS 5-2.5-18.5 LF-MCG/0.5 IM SUSY
0.5000 mL | PREFILLED_SYRINGE | Freq: Once | INTRAMUSCULAR | Status: AC
  Administered 2023-07-23: 0.5 mL via INTRAMUSCULAR
  Filled 2023-07-23: qty 0.5

## 2023-07-23 NOTE — ED Triage Notes (Addendum)
 Pt BIB [facility] from [facility] for skin tears to his forearms. Pt was found to be sitting in his wheelchair with his arms bleeding. No known falls. Pt has significant bruising to R shoulder.

## 2023-07-23 NOTE — ED Notes (Signed)
 This RN and Dr. Scotty Court removed pressure dressing to make sure that the bleeding had stopped after previous interventions. Pt skin tear was still bleeding and it was decided that a sutured would be placed by the EDP and then a new dressing will be applied.

## 2023-07-23 NOTE — ED Notes (Addendum)
 This RN cleaned and redressed pt's skin tear on the lower left leg. While cleaning and dressing pt's right forearm skin tear, it was noticed that pt had sutures from previous visit to ED 2 days ago that was still bleeding. Dr. Scotty Court made aware and came to bedside to address bleeding. Dr. Scotty Court attempted to get bleeding to stop and wound was covered with gauze and coband.

## 2023-07-23 NOTE — ED Provider Notes (Signed)
 [facility] Provider Note    Event Date/Time   First MD Initiated Contact with Patient 07/23/23 [phone]     (approximate)   History   Chief Complaint: Skin Tears   HPI  Luis Doyle is a 89 y.o. male with a history of dementia who was sent to the ED from [facility] due to skin tears on his forearms.  No known fall.  No trauma, no recent illness.  Patient denies any complaints.          Physical Exam   Triage Vital Signs: ED Triage Vitals  Encounter Vitals Group     BP 07/23/23 0856 (!) 157/44     Systolic BP Percentile --      Diastolic BP Percentile --      Pulse Rate 07/23/23 0856 67     Resp 07/23/23 0856 17     Temp 07/23/23 0856 (!) 97.5 F (36.4 C)     Temp Source 07/23/23 0856 Axillary     SpO2 07/23/23 0856 100 %     Weight 07/23/23 0850 119 lb 1.6 oz (54 kg)     Height 07/23/23 0850 6' (1.829 m)     Head Circumference --      Peak Flow --      Pain Score --      Pain Loc --      Pain Education --      Exclude from Growth Chart --     Most recent vital signs: Vitals:   07/23/23 0930 07/23/23 1030  BP: (!) 157/45 139/88  Pulse: (!) 58 70  Resp: 12 17  Temp:    SpO2: 100% 99%    General: Awake, no distress.  CV:  Good peripheral perfusion.  Regular rate and rhythm Resp:  Normal effort.  Clear to auscultation bilaterally Abd:  No distention.  Soft nontender Other:  Multiple small skin tears on right forearm, left shin.  Innumerable bruises of various ages on all extremities.  Skin tear on the right forearm has simple interrupted sutures in it, with a small area of dehiscence in the center of the laceration with pulsatile bleeding.   ED Results / Procedures / Treatments   Labs (all labs ordered are listed, but only abnormal results are displayed) Labs Reviewed  BASIC METABOLIC PANEL - Abnormal; Notable for the following components:      Result Value   BUN 25 (*)    Calcium 8.6 (*)    GFR, Estimated 54 (*)     All other components within normal limits  CBC WITH DIFFERENTIAL/PLATELET - Abnormal; Notable for the following components:   RBC 3.02 (*)    Hemoglobin 9.9 (*)    HCT 29.5 (*)    RDW 19.0 (*)    All other components within normal limits  SAMPLE TO BLOOD BANK     EKG    RADIOLOGY CT head interpreted by me, negative for intracranial hemorrhage.  Radiology report reviewed   PROCEDURES:  .Laceration Repair  Date/Time: 07/23/2023 4:54 PM  Performed by: Sharman Cheek, MD Authorized by: Sharman Cheek, MD   Consent:    Consent obtained:  Emergent situation Laceration details:    Length (cm):  1 Exploration:    Hemostasis achieved with:  Tied off vessels and cautery   Wound extent: vascular damage     Wound extent: fascia not violated, no foreign body, no signs of injury, no nerve damage, no tendon damage and no underlying fracture  Contaminated: no   Treatment:    Area cleansed with:  Povidone-iodine   Irrigation solution:  Sterile saline Skin repair:    Repair method:  Sutures   Suture size:  4-0   Suture material:  Plain gut   Suture technique:  Figure eight Approximation:    Approximation:  Close Repair type:    Repair type:  Simple Post-procedure details:    Dressing:  Sterile dressing   Procedure completion:  Tolerated well, no immediate complications    MEDICATIONS ORDERED IN ED: Medications  sodium chloride 0.9 % bolus 1,000 mL (1,000 mLs Intravenous New Bag/Given 07/23/23 1030)  Tdap (BOOSTRIX) injection 0.5 mL (0.5 mLs Intramuscular Given 07/23/23 1032)     IMPRESSION / MDM / ASSESSMENT AND PLAN / ED COURSE  I reviewed the triage vital signs and the nursing notes.  DDx: Intracranial hemorrhage, anemia, thrombocytopenia, uremia, AKI  Patient's presentation is most consistent with acute presentation with potential threat to life or bodily function.  Patient sent to the ED due to skin tears, no reported mechanism of trauma.  He is nontoxic  and calm.  Vital signs reassuring.  Lab workup and CT head are unremarkable.  Right forearm skin tear was sutured to ligate bleeding subcutaneous arterial vessel.  This was successful, wound was dressed and patient stable for discharge.       FINAL CLINICAL IMPRESSION(S) / ED DIAGNOSES   Final diagnoses:  Skin tear of forearm without complication, right, initial encounter  Chronic dementia (HCC)     Rx / DC Orders   ED Discharge Orders     None        Note:  This document was prepared using Dragon voice recognition software and may include unintentional dictation errors.   Sharman Cheek, MD 07/23/23 [phone]

## 2023-07-23 NOTE — ED Notes (Signed)
 [facility] called for transport to [facility]

## 2023-07-23 NOTE — Discharge Instructions (Signed)
 Lab tests and CT scan of the head were all okay today. Continue to provide wound care to allow skin to heal.

## 2023-10-12 IMAGING — CT CT ABD-PELV W/O CM
2 of 4 series · 16 of 46 positions shown, 18 images · non-contrast
Comparison: None.

CLINICAL DATA: Nausea/vomiting Bowel obstruction suspected

EXAM:
CT ABDOMEN AND PELVIS WITHOUT CONTRAST
TECHNIQUE: Multidetector CT imaging of the abdomen and pelvis was performed
following the standard protocol without IV contrast.

[Series 2: routine abd/pel wo · axial · 0.86mm/px · z∈[-1311,-801]mm · 13 of 112 slices shown, 15 images]
[im 5/112  soft-tissue]
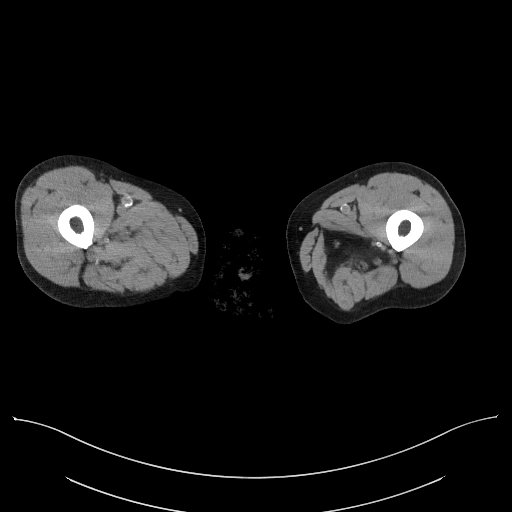
[im 5/112  bone]
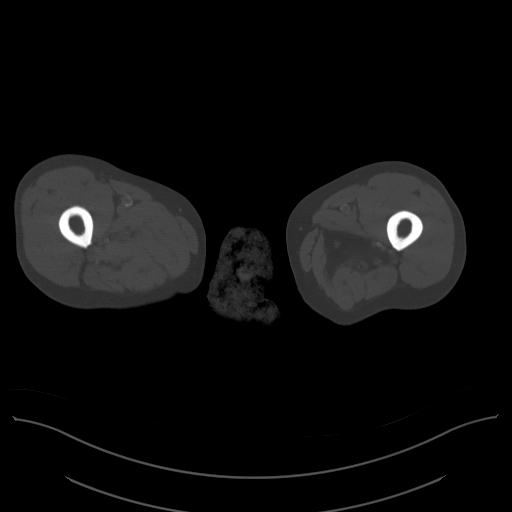
[im 13/112  soft-tissue]
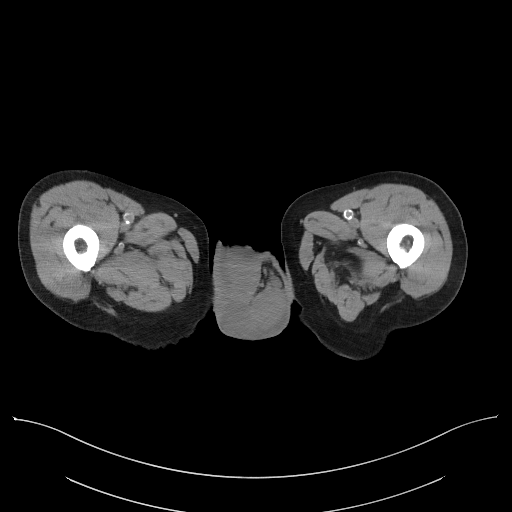
[im 22/112  soft-tissue]
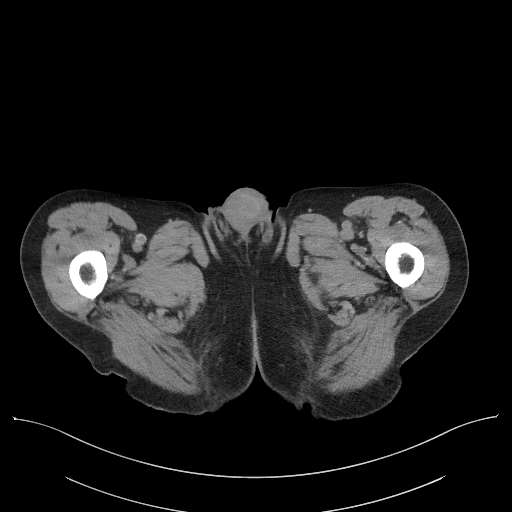
[im 30/112  soft-tissue]
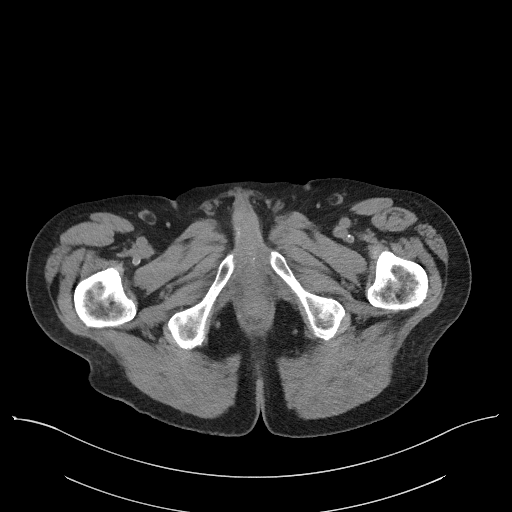
[im 39/112  soft-tissue]
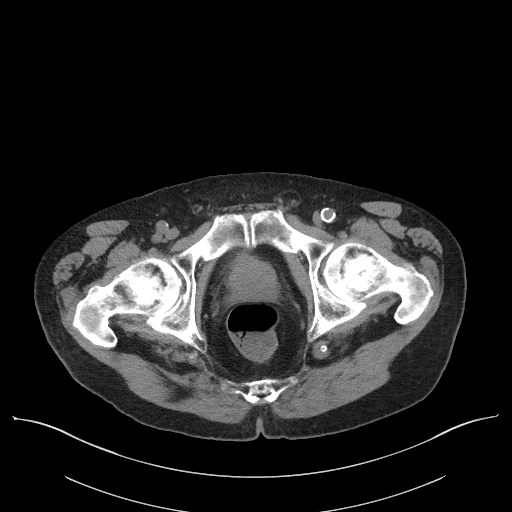
[im 47/112  soft-tissue]
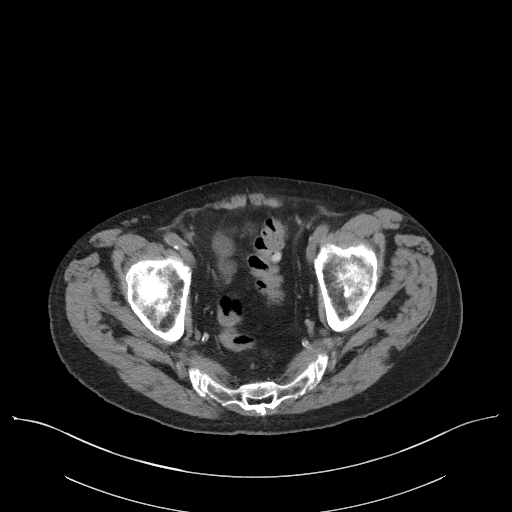
[im 56/112  soft-tissue]
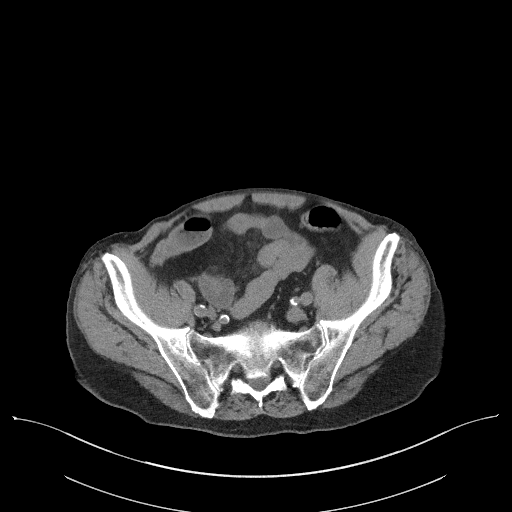
[im 65/112  soft-tissue]
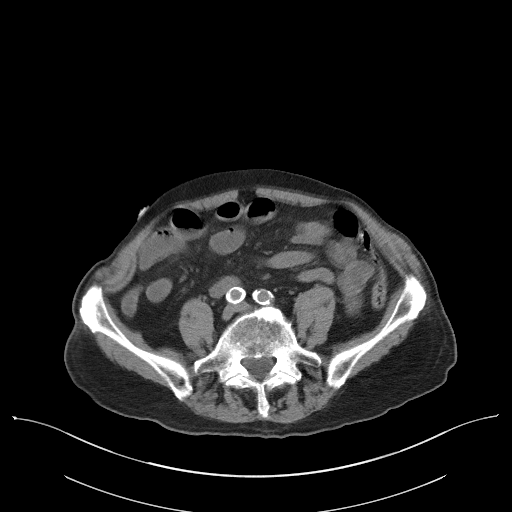
[im 73/112  soft-tissue]
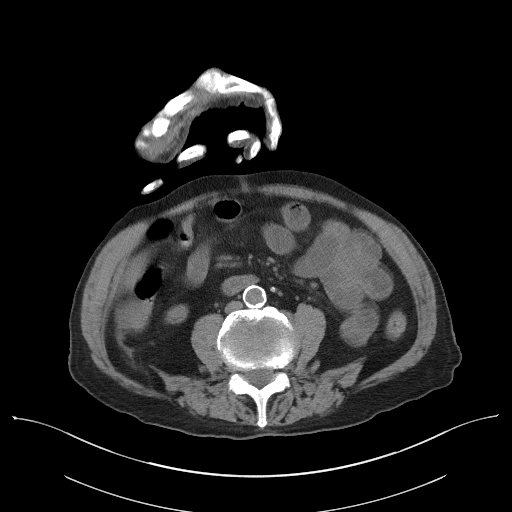
[im 73/112  bone]
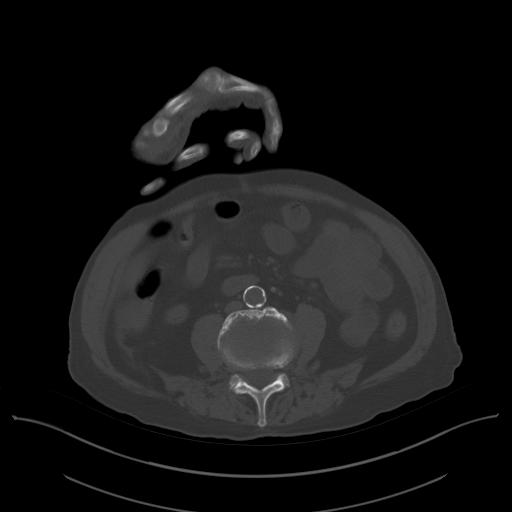
[im 82/112  soft-tissue]
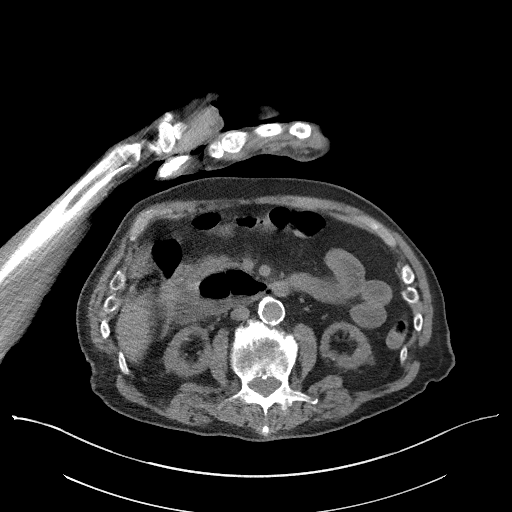
[im 90/112  soft-tissue]
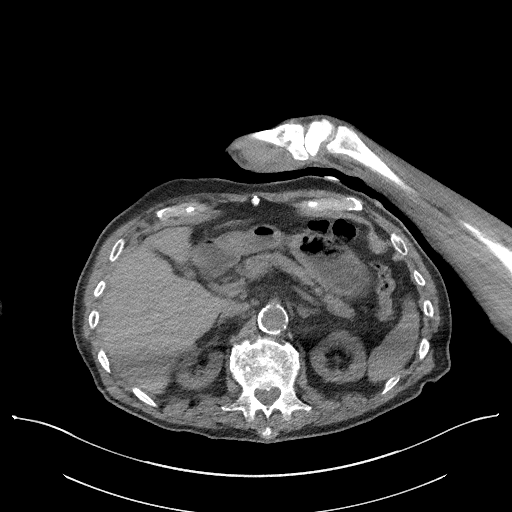
[im 99/112  soft-tissue]
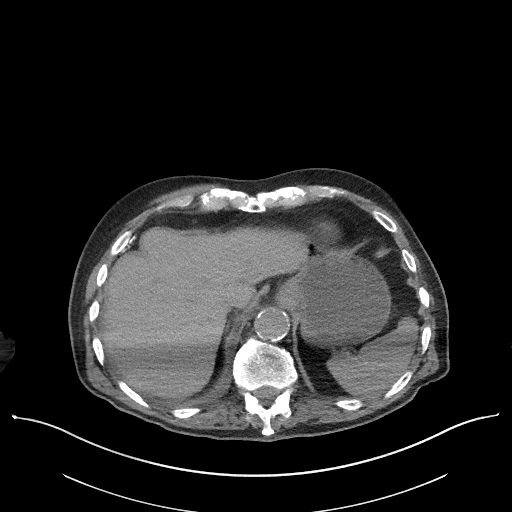
[im 107/112  soft-tissue]
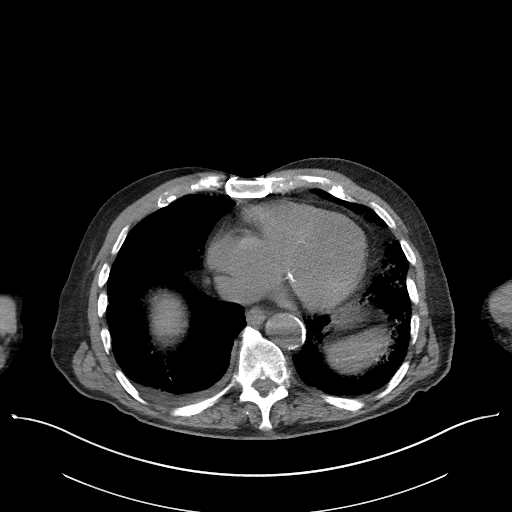

[Series 5: coronal st · coronal · 0.80mm/px · 3 of 88 slices shown]
[im 30/88  soft-tissue]
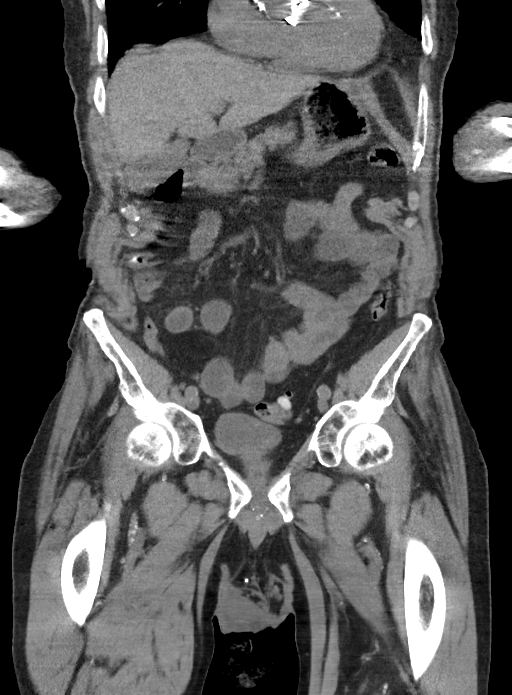
[im 39/88  soft-tissue]
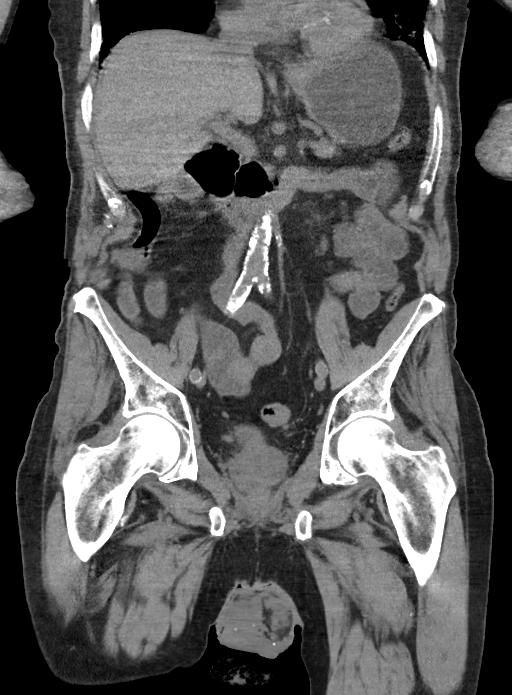
[im 49/88  soft-tissue]
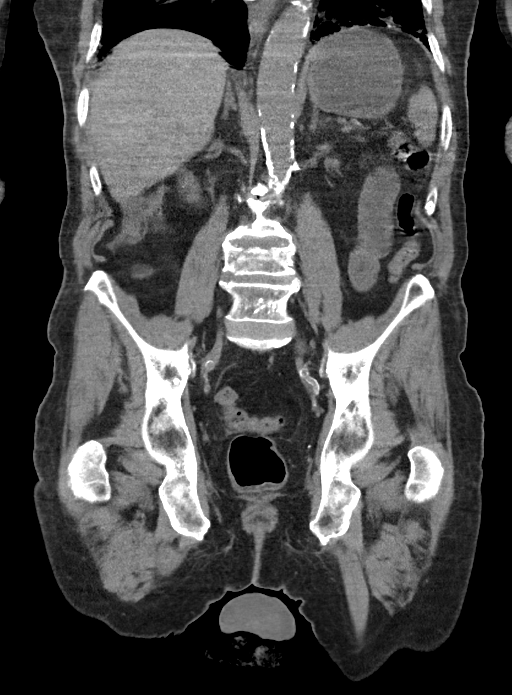

[16 of 46 positions shown; findings below may reference images not displayed]

FINDINGS: Lower chest: Trace right pleural effusion. Reticulonodular opacities
within the bilateral lung bases, left worse than right. Heart size
within normal limits.

Hepatobiliary: Unremarkable unenhanced appearance of the liver. No
focal liver lesion identified. Gallbladder within normal limits. No
hyperdense gallstone. No biliary dilatation.

Pancreas: Unremarkable. No pancreatic ductal dilatation or
surrounding inflammatory changes.

Spleen: Normal in size without focal abnormality.

Adrenals/Urinary Tract: Unremarkable adrenal glands. Bilateral renal
cortical atrophy. No renal stone or hydronephrosis. Urinary bladder
is within normal limits for the degree of distension.

Stomach/Bowel: Stomach within normal limits. Multiple fluid-filled,
slightly thickened loops of small bowel. No dilated loops of bowel
to suggest obstruction. Minimal scattered colonic diverticulosis.

Vascular/Lymphatic: Aortic atherosclerosis. No enlarged abdominal or
pelvic lymph nodes.

Reproductive: Prostate gland is mildly enlarged.

Other: No free fluid. No abdominopelvic fluid collection. No
pneumoperitoneum. No abdominal wall hernia.

Musculoskeletal: Chronic-appearing mild superior endplate
compression deformities of L1 and L4. No acute osseous abnormality.
IMPRESSION: 1. Multiple fluid-filled, slightly thickened loops of small bowel,
suggestive of an infectious or inflammatory enteritis. No evidence
of bowel obstruction.
2. Trace right pleural effusion.
3. Reticulonodular opacities within the bilateral lung bases, left
worse than right, suspicious for an infectious or inflammatory
process.
4. Chronic-appearing mild superior endplate compression deformities
of L1 and L4.

Aortic Atherosclerosis (MICY1-FJY.Y).

## 2023-11-23 DEATH — deceased
